# Patient Record
Sex: Female | Born: 1965 | Race: Black or African American | Hispanic: No | Marital: Single | Smoking: Never smoker
Health system: Southern US, Community
[De-identification: ages and names within clinical notes are randomized; demographics above are authoritative.]

## PROBLEM LIST (undated history)

## (undated) DIAGNOSIS — I10 Essential (primary) hypertension: Secondary | ICD-10-CM

## (undated) HISTORY — PX: ABDOMINAL HYSTERECTOMY: SHX81

---

## 2007-02-02 ENCOUNTER — Ambulatory Visit: Payer: Self-pay

## 2007-02-06 ENCOUNTER — Ambulatory Visit: Payer: Self-pay

## 2007-11-19 ENCOUNTER — Ambulatory Visit: Payer: Self-pay | Admitting: Obstetrics and Gynecology

## 2009-02-03 ENCOUNTER — Ambulatory Visit: Payer: Self-pay

## 2013-01-23 ENCOUNTER — Ambulatory Visit: Payer: Self-pay | Admitting: Nurse Practitioner

## 2013-01-31 ENCOUNTER — Ambulatory Visit: Payer: Self-pay | Admitting: Nurse Practitioner

## 2013-03-03 ENCOUNTER — Ambulatory Visit: Payer: Self-pay | Admitting: Nurse Practitioner

## 2013-04-02 ENCOUNTER — Ambulatory Visit: Payer: Self-pay | Admitting: Nurse Practitioner

## 2013-05-27 ENCOUNTER — Ambulatory Visit: Payer: Self-pay | Admitting: Nurse Practitioner

## 2013-06-03 ENCOUNTER — Ambulatory Visit: Payer: Self-pay | Admitting: Nurse Practitioner

## 2014-09-01 ENCOUNTER — Ambulatory Visit: Payer: Self-pay | Admitting: Family Medicine

## 2016-07-22 ENCOUNTER — Other Ambulatory Visit: Payer: Self-pay | Admitting: Family Medicine

## 2016-07-22 DIAGNOSIS — Z1231 Encounter for screening mammogram for malignant neoplasm of breast: Secondary | ICD-10-CM

## 2016-07-27 ENCOUNTER — Ambulatory Visit
Admission: RE | Admit: 2016-07-27 | Discharge: 2016-07-27 | Disposition: A | Discharge status: Home / Self Care | Payer: 59 | Source: Ambulatory Visit | Attending: Family Medicine | Admitting: Family Medicine

## 2016-07-27 ENCOUNTER — Encounter: Payer: Self-pay | Admitting: Radiology

## 2016-07-27 DIAGNOSIS — Z1231 Encounter for screening mammogram for malignant neoplasm of breast: Secondary | ICD-10-CM | POA: Diagnosis present

## 2017-06-10 ENCOUNTER — Ambulatory Visit (HOSPITAL_COMMUNITY)
Admission: EM | Admit: 2017-06-10 | Discharge: 2017-06-10 | Disposition: A | Discharge status: Home / Self Care | Payer: 59

## 2017-07-10 ENCOUNTER — Other Ambulatory Visit: Payer: Self-pay | Admitting: Family Medicine

## 2017-07-10 DIAGNOSIS — Z1231 Encounter for screening mammogram for malignant neoplasm of breast: Secondary | ICD-10-CM

## 2017-08-02 ENCOUNTER — Ambulatory Visit
Admission: RE | Admit: 2017-08-02 | Discharge: 2017-08-02 | Disposition: A | Discharge status: Home / Self Care | Payer: BLUE CROSS/BLUE SHIELD | Source: Ambulatory Visit | Attending: Family Medicine | Admitting: Family Medicine

## 2017-08-02 DIAGNOSIS — Z1231 Encounter for screening mammogram for malignant neoplasm of breast: Secondary | ICD-10-CM

## 2018-01-08 ENCOUNTER — Encounter (INDEPENDENT_AMBULATORY_CARE_PROVIDER_SITE_OTHER): Payer: Self-pay | Admitting: Orthopedic Surgery

## 2018-01-08 ENCOUNTER — Ambulatory Visit (INDEPENDENT_AMBULATORY_CARE_PROVIDER_SITE_OTHER): Payer: BLUE CROSS/BLUE SHIELD | Admitting: Orthopedic Surgery

## 2018-01-08 DIAGNOSIS — M7542 Impingement syndrome of left shoulder: Secondary | ICD-10-CM

## 2018-01-11 ENCOUNTER — Encounter (INDEPENDENT_AMBULATORY_CARE_PROVIDER_SITE_OTHER): Payer: Self-pay | Admitting: Orthopedic Surgery

## 2018-01-11 NOTE — Progress Notes (Signed)
Office Visit Note   Patient: Leah Rush           Date of Birth: 04-09-1966           MRN: [id number] Visit Date: 01/08/2018 Requested by: Rayetta HumphreyGeorge, Sionne A, MD [address] [place], [place] [id number] PCP: Rayetta HumphreyGeorge, Sionne A, MD  Subjective: Chief Complaint  Patient presents with  . Left Shoulder - Pain  . Right Shoulder - Pain    HPI: Leah Rush is a 52 year old patient with bilateral shoulder pain left worse than right.  Pain is been going on for 3 months.  Denies any history of injury.  Reported relatively acute onset of pain which is fairly constant.  Will wake her from sleep at night.  She is right-hand dominant.  Saw primary care provider who did radiographs and MRI of left shoulder.  That MRI scan is reviewed.  She does have a little bit of calcification at the superior attachment site of the subscap.  Cervical spine plain radiographs show mild degenerative disc disease at C5-6.  She reports pain in the deltoid region with forward flexion more than 90 degrees.  Takes occasional ibuprofen for her symptoms.  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  Wide grip hurts more than close grip.              ROS: All systems reviewed are negative as they relate to the chief complaint within the history of present illness.  Patient denies  fevers or chills.   Assessment & Plan: Visit Diagnoses:  1. Impingement syndrome of left shoulder     Plan: Impression is impingement and possible symptomatic calcific tendinitis in that left shoulder.  Plan is subacromial injection today for pain relief.  Rotator cuff looks reasonable otherwise.  Labrum also looks reasonable.  If this does not help then we could consider some type of operative intervention.  I will see her back as needed.  I do want her to take about a week off from lifting the let this shot kick in.  Follow-Up Instructions: Return if symptoms worsen or fail to improve.    Orders:  No orders of the defined types were placed in this encounter.  No orders of the defined types were placed in this encounter.     Procedures: No procedures performed   Clinical Data: No additional findings.  Objective: Vital Signs: There were no vitals taken for this visit.  Physical Exam:   Constitutional: Patient appears well-developed HEENT:  Head: Normocephalic Eyes:EOM are normal Neck: Normal range of motion Cardiovascular: Normal rate Pulmonary/chest: Effort normal Neurologic: Patient is alert Skin: Skin is warm Psychiatric: Patient has normal mood and affect    Ortho Exam: Orthopedic exam demonstrates pretty full active and passive range of motion of both shoulders with good cervical spine range of motion.  5 out of 5 grip EPL FPL interosseous wrist flexion wrist extension biceps triceps and deltoid strength.  Patient has palpable radial pulses.  No other masses lymph adenopathy or skin changes noted in that shoulder girdle region.  No restriction of passive range of motion in that shoulder.  Specialty Comments:  No specialty comments available.  Imaging: No results found.   PMFS History: There are no active problems to display for this patient.  History reviewed. No pertinent past medical history.  Family History  Problem Relation Age of Onset  . Breast cancer Neg Hx  Past Surgical History:  Procedure Laterality Date  . ABDOMINAL HYSTERECTOMY     Social History   Occupational History  . Not on file  Tobacco Use  . Smoking status: Not on file  Substance and Sexual Activity  . Alcohol use: Not on file  . Drug use: Not on file  . Sexual activity: Not on file

## 2018-03-17 ENCOUNTER — Ambulatory Visit (HOSPITAL_COMMUNITY)
Admission: EM | Admit: 2018-03-17 | Discharge: 2018-03-17 | Disposition: A | Discharge status: Home / Self Care | Payer: BLUE CROSS/BLUE SHIELD | Attending: Family Medicine | Admitting: Family Medicine

## 2018-03-17 ENCOUNTER — Other Ambulatory Visit: Payer: Self-pay

## 2018-03-17 ENCOUNTER — Encounter (HOSPITAL_COMMUNITY): Payer: Self-pay

## 2018-03-17 DIAGNOSIS — R05 Cough: Secondary | ICD-10-CM

## 2018-03-17 DIAGNOSIS — J029 Acute pharyngitis, unspecified: Secondary | ICD-10-CM | POA: Diagnosis not present

## 2018-03-17 DIAGNOSIS — J069 Acute upper respiratory infection, unspecified: Secondary | ICD-10-CM

## 2018-03-17 LAB — POCT RAPID STREP A: STREPTOCOCCUS, GROUP A SCREEN (DIRECT): NEGATIVE

## 2018-03-17 NOTE — Discharge Instructions (Signed)
Push fluids to ensure adequate hydration and keep secretions thin. Tylenol and/or ibuprofen as needed for pain or fevers.  Continue with teas, lozenges, broth, etc to help with sore throat.  Nasal sprays can be helpful with post nasal drip. If symptoms worsen or do not improve in the next week to return to be seen or to follow up with your PCP.

## 2018-03-17 NOTE — ED Triage Notes (Signed)
Pt presents today with sore throat and neck stiffness that started 2 days ago. States she has had some chills but no fever that she knows of. States she does have some drainage as well.

## 2018-03-17 NOTE — ED Provider Notes (Signed)
[facility]    CSN: [id number] Arrival date & time: 03/17/18  1447     History   Chief Complaint Chief Complaint  Patient presents with  . Sore Throat    HPI Roberto Scalesrnestine Tuckerman is a 52 y.o. female.   [name] presents with complaints of sore throat, post nasal drip, swelling near right ear and neck stiffness which started two days ago. Chills. Slight cough due to post nasal drip. No runny nose. No ear pain. No known ill contacts. No gi/gu complaints. Has been taking over the counter treatment which has slightly helped. States has a nasal spray at home which she does not remember the name, but does not take regularly. Has been drinking hot tea with lemon which helps some. Hx of htn.    ROS per HPI.      History reviewed. No pertinent past medical history.  There are no active problems to display for this patient.   Past Surgical History:  Procedure Laterality Date  . ABDOMINAL HYSTERECTOMY      OB History   None      Home Medications    Prior to Admission medications   Medication Sig Start Date End Date Taking? Authorizing Provider  metoprolol-hydrochlorothiazide (LOPRESSOR HCT) 50-25 MG tablet Take 1 tablet by mouth daily.   Yes [provider]    Family History Family History  Problem Relation Age of Onset  . Breast cancer Neg Hx     Social History Social History   Tobacco Use  . Smoking status: Never Smoker  . Smokeless tobacco: Never Used  Substance Use Topics  . Alcohol use: Not Currently  . Drug use: Not Currently     Allergies   Tetanus toxoid   Review of Systems Review of Systems   Physical Exam Triage Vital Signs ED Triage Vitals  Enc Vitals Group     BP 03/17/18 1607 130/66     Pulse Rate 03/17/18 1607 86     Resp 03/17/18 1607 16     Temp 03/17/18 1607 99.2 F (37.3 C)     Temp Source 03/17/18 1607 Oral     SpO2 03/17/18 1607 100 %     Weight --      Height --      Head Circumference --      Peak  Flow --      Pain Score 03/17/18 1608 7     Pain Loc --      Pain Edu? --      Excl. in GC? --    No data found.  Updated Vital Signs BP 130/66 (BP Location: Left Arm)   Pulse 86   Temp 99.2 F (37.3 C) (Oral)   Resp 16   SpO2 100%   Visual Acuity Right Eye Distance:   Left Eye Distance:   Bilateral Distance:    Right Eye Near:   Left Eye Near:    Bilateral Near:     Physical Exam  Constitutional: She is oriented to person, place, and time. She appears well-developed and well-nourished. No distress.  HENT:  Head: Normocephalic and atraumatic.  Right Ear: External ear and ear canal normal. Tympanic membrane is injected.  Left Ear: Tympanic membrane, external ear and ear canal normal.  Nose: Nose normal.  Mouth/Throat: Uvula is midline, oropharynx is clear and moist and mucous membranes are normal. Tonsils are 1+ on the right. Tonsils are 1+ on the left. No tonsillar exudate.  Palpable preauricular nodes to right; non  tender; minimal swelling   Eyes: Pupils are equal, round, and reactive to light. Conjunctivae and EOM are normal.  Cardiovascular: Normal rate, regular rhythm and normal heart sounds.  Pulmonary/Chest: Effort normal and breath sounds normal.  Lymphadenopathy:    She has no cervical adenopathy.  Neurological: She is alert and oriented to person, place, and time.  Skin: Skin is warm and dry.     UC Treatments / Results  Labs (all labs ordered are listed, but only abnormal results are displayed) Labs Reviewed  CULTURE, GROUP A STREP [facility])  POCT RAPID STREP A    EKG None  Radiology No results found.  Procedures Procedures (including critical care time)  Medications Ordered in UC Medications - No data to display  Initial Impression / Assessment and Plan / UC Course  I have reviewed the triage vital signs and the nursing notes.  Pertinent labs & imaging results that were available during my care of the patient were reviewed by me and  considered in my medical decision making (see chart for details).     Negative rapid strep. Benign physical exam. History and physical consistent with viral illness.  Supportive cares recommended. Return precautions provided.  Patient verbalized understanding and agreeable to plan.  Ambulatory out of clinic without difficulty.    Final Clinical Impressions(s) / UC Diagnoses   Final diagnoses:  Upper respiratory tract infection, unspecified type     Discharge Instructions     Push fluids to ensure adequate hydration and keep secretions thin. Tylenol and/or ibuprofen as needed for pain or fevers.  Continue with teas, lozenges, broth, etc to help with sore throat.  Nasal sprays can be helpful with post nasal drip. If symptoms worsen or do not improve in the next week to return to be seen or to follow up with your PCP.     ED Prescriptions    None     Controlled Substance Prescriptions [place] Controlled Substance Registry consulted? Not Applicable   Georgetta Haber, NP 03/17/18 1702

## 2018-03-20 LAB — CULTURE, GROUP A STREP (THRC)

## 2018-05-03 ENCOUNTER — Other Ambulatory Visit: Payer: Self-pay | Admitting: Family Medicine

## 2018-05-03 DIAGNOSIS — E041 Nontoxic single thyroid nodule: Secondary | ICD-10-CM

## 2018-05-03 DIAGNOSIS — I10 Essential (primary) hypertension: Secondary | ICD-10-CM

## 2018-06-28 ENCOUNTER — Other Ambulatory Visit: Payer: Self-pay | Admitting: Family Medicine

## 2018-06-28 DIAGNOSIS — Z1231 Encounter for screening mammogram for malignant neoplasm of breast: Secondary | ICD-10-CM

## 2018-08-06 ENCOUNTER — Encounter (INDEPENDENT_AMBULATORY_CARE_PROVIDER_SITE_OTHER): Payer: Self-pay

## 2018-08-06 ENCOUNTER — Ambulatory Visit
Admission: RE | Admit: 2018-08-06 | Discharge: 2018-08-06 | Disposition: A | Discharge status: Home / Self Care | Payer: BLUE CROSS/BLUE SHIELD | Source: Ambulatory Visit | Attending: Family Medicine | Admitting: Family Medicine

## 2018-08-06 DIAGNOSIS — Z1231 Encounter for screening mammogram for malignant neoplasm of breast: Secondary | ICD-10-CM | POA: Diagnosis present

## 2019-05-06 ENCOUNTER — Ambulatory Visit: Payer: Self-pay

## 2019-05-06 ENCOUNTER — Other Ambulatory Visit: Payer: Self-pay

## 2019-05-06 VITALS — BP 140/98 | HR 84 | Temp 98.8°F | Resp 17

## 2019-05-06 DIAGNOSIS — S80869A Insect bite (nonvenomous), unspecified lower leg, initial encounter: Secondary | ICD-10-CM

## 2019-05-06 MED ORDER — DIPHENHYDRAMINE HCL 25 MG PO CAPS
25.0000 mg | ORAL_CAPSULE | Freq: Once | ORAL | Status: AC
  Administered 2019-05-06: 25 mg via ORAL

## 2019-05-06 MED ORDER — BACITRACIN-NEOMYCIN-POLYMYXIN 400-5-5000 EX OINT
TOPICAL_OINTMENT | Freq: Once | CUTANEOUS | Status: AC
  Administered 2019-05-06: 11:00:00 via TOPICAL

## 2019-05-06 NOTE — Patient Instructions (Signed)
Diphenhydramine capsules or tablets What is this medicine? DIPHENHYDRAMINE (dye fen HYE dra meen) is an antihistamine. It is used to treat the symptoms of an allergic reaction. It is also used to treat Parkinson's disease. This medicine is also used to prevent and to treat motion sickness and as a nighttime sleep aid. This medicine may be used for other purposes; ask your health care provider or pharmacist if you have questions. COMMON BRAND NAME(S): Alka-Seltzer Plus Allergy, Aller-G-Time, Banophen, Benadryl Allergy, Benadryl Allergy Dye Free, Benadryl Allergy Kapgel, Benadryl Allergy Ultratab, Diphedryl, Diphenhist, Genahist, Geri-Dryl, PHARBEDRYL, Q-Dryl, Gretta Began, Valu-Dryl, Vicks ZzzQuil Nightime Sleep-Aid What should I tell my health care provider before I take this medicine? They need to know if you have any of these conditions:  asthma or lung disease  glaucoma  high blood pressure or heart disease  liver disease  pain or difficulty passing urine  prostate trouble  ulcers or other stomach problems  an unusual or allergic reaction to diphenhydramine, other medicines foods, dyes, or preservatives such as sulfites  pregnant or trying to get pregnant  breast-feeding How should I use this medicine? Take this medicine by mouth with a full glass of water. Follow the directions on the prescription label. Take your doses at regular intervals. Do not take your medicine more often than directed. To prevent motion sickness start taking this medicine 30 to 60 minutes before you leave. Talk to your pediatrician regarding the use of this medicine in children. Special care may be needed. Patients over 56 years old may have a stronger reaction and need a smaller dose. Overdosage: If you think you have taken too much of this medicine contact a poison control center or emergency room at once. NOTE: This medicine is only for you. Do not share this medicine with others. What if I miss a dose?  If you miss a dose, take it as soon as you can. If it is almost time for your next dose, take only that dose. Do not take double or extra doses. What may interact with this medicine? Do not take this medicine with any of the following medications:  MAOIs like Carbex, Eldepryl, Marplan, Nardil, and Parnate This medicine may also interact with the following medications:  alcohol  barbiturates, like phenobarbital  medicines for bladder spasm like oxybutynin, tolterodine  medicines for blood pressure  medicines for depression, anxiety, or psychotic disturbances  medicines for movement abnormalities or Parkinson's disease  medicines for sleep  other medicines for cold, cough or allergy  some medicines for the stomach like chlordiazepoxide, dicyclomine This list may not describe all possible interactions. Give your health care provider a list of all the medicines, herbs, non-prescription drugs, or dietary supplements you use. Also tell them if you smoke, drink alcohol, or use illegal drugs. Some items may interact with your medicine. What should I watch for while using this medicine? Visit your doctor or health care professional for regular check ups. Tell your doctor if your symptoms do not improve or if they get worse. Your mouth may get dry. Chewing sugarless gum or sucking hard candy, and drinking plenty of water may help. Contact your doctor if the problem does not go away or is severe. This medicine may cause dry eyes and blurred vision. If you wear contact lenses you may feel some discomfort. Lubricating drops may help. See your eye doctor if the problem does not go away or is severe. You may get drowsy or dizzy. Do not drive, use machinery, or  do anything that needs mental alertness until you know how this medicine affects you. Do not stand or sit up quickly, especially if you are an older patient. This reduces the risk of dizzy or fainting spells. Alcohol may interfere with the  effect of this medicine. Avoid alcoholic drinks. What side effects may I notice from receiving this medicine? Side effects that you should report to your doctor or health care professional as soon as possible:  allergic reactions like skin rash, itching or hives, swelling of the face, lips, or tongue  changes in vision  confused, agitated, nervous  irregular or fast heartbeat  tremor  trouble passing urine  unusual bleeding or bruising  unusually weak or tired Side effects that usually do not require medical attention (report to your doctor or health care professional if they continue or are bothersome):  constipation, diarrhea  drowsy  headache  loss of appetite  stomach upset, vomiting  thick mucous This list may not describe all possible side effects. Call your doctor for medical advice about side effects. You may report side effects to FDA at 1-800-FDA-1088. Where should I keep my medicine? Keep out of the reach of children. Store at room temperature between 15 and 30 degrees C (59 and 86 degrees F). Keep container closed tightly. Throw away any unused medicine after the expiration date. NOTE: This sheet is a summary. It may not cover all possible information. If you have questions about this medicine, talk to your doctor, pharmacist, or health care provider.  2020 Elsevier/Gold Standard (2008-01-07 17:06:22) Insect Bite, Adult An insect bite can make your skin red, itchy, and swollen. Some insects can spread disease to people with a bite. However, most insect bites do not lead to disease, and most are not serious. What are the causes? Insects may bite for many reasons, including:  Hunger.  To defend themselves. Insects that bite include:  Spiders.  Mosquitoes.  Ticks.  Fleas.  Ants.  Flies.  Kissing bugs.  Chiggers. What are the signs or symptoms? Symptoms of this condition include:  Itching or pain in the bite area.  Redness and swelling in  the bite area.  An open wound (skin ulcer). Symptoms often last for 2-4 days. In rare cases, a person may have a very bad allergic reaction (anaphylactic reaction) to a bite. Symptoms of an anaphylactic reaction may include:  Feeling warm in the face (flushed). Your face may turn red.  Itchy, red, swollen areas of skin (hives).  Swelling of the: ? Eyes. ? Lips. ? Face. ? Mouth. ? Tongue. ? Throat.  Trouble with any of these: ? Breathing. ? Talking. ? Swallowing.  Loud breathing (wheezing).  Feeling dizzy or light-headed.  Passing out (fainting).  Pain or cramps in your belly.  Throwing up (vomiting).  Watery poop (diarrhea). How is this treated? Treatment is usually not needed. Symptoms often go away on their own. When treatment is needed, it may involve:  Putting a cream or lotion on the bite area. This helps with itching.  Taking an antibiotic medicine. This treatment is needed if the bite area gets infected.  Getting a tetanus shot, if you are not up to date on this vaccine.  Putting ice on the affected area.  Using medicines called antihistamines. This treatment may be needed if you have itching or an allergic reaction to the insect bite.  Giving yourself a shot of medicine (epinephrine) using an auto-injector "pen" if you have an anaphylactic reaction to a bite. Your  doctor will teach you how to use this pen. Follow these instructions at home: Bite area care   Do not scratch the bite area.  Keep the bite area clean and dry.  Wash the bite area every day with soap and water as told by your doctor.  Check the bite area every day for signs of infection. Check for: ? Redness, swelling, or pain. ? Fluid or blood. ? Warmth. ? Pus or a bad smell. Managing pain, itching, and swelling   You may put any of these on the bite area as told by your doctor: ? A paste made of baking soda and water. ? Cortisone cream. ? Calamine lotion.  If told, put ice on  the bite area. ? Put ice in a plastic bag. ? Place a towel between your skin and the bag. ? Leave the ice on for 20 minutes, 2-3 times a day. General instructions  Apply or take over-the-counter and prescription medicines only as told by your doctor.  If you were prescribed an antibiotic medicine, take or apply it as told by your doctor. Do not stop using the antibiotic even if your condition improves.  Keep all follow-up visits as told by your doctor. This is important. How is this prevented? To help you have a lower risk of insect bites:  When you are outside, wear clothing that covers your arms and legs.  Use insect repellent. The best insect repellents contain one of these: ? DEET. ? Picaridin. ? Oil of lemon eucalyptus (OLE). ? IR3535.  Consider spraying your clothing with a pesticide called permethrin. Permethrin helps prevent insect bites. It works for several weeks and for up to 5-6 clothing washes. Do not apply permethrin directly to the skin.  If your home windows do not have screens, think about putting some in.  If you will be sleeping in an area where there are mosquitoes, consider covering your sleeping area with a mosquito net. Contact a doctor if:  You have redness, swelling, or pain in the bite area.  You have fluid or blood coming from the bite area.  The bite area feels warm to the touch.  You have pus or a bad smell coming from the bite area.  You have a fever. Get help right away if:  You have joint pain.  You have a rash.  You feel more tired or sleepy than you normally do.  You have neck pain.  You have a headache.  You feel weaker than you normally do.  You have signs of an anaphylactic reaction. Signs may include: ? Feeling warm in the face. ? Itchy, red, swollen areas of skin. ? Swelling of your:  Eyes.  Lips.  Face.  Mouth.  Tongue.  Throat. ? Trouble with any of these:  Breathing.  Talking.  Swallowing. ? Loud  breathing. ? Feeling dizzy or light-headed. ? Passing out. ? Pain or cramps in your belly. ? Throwing up. ? Watery poop. These symptoms may be an emergency. Do not wait to see if the symptoms will go away. Do this right away:  Use your auto-injector pen as you have been told.  Get medical help. Call your local emergency services (911 in the [place] Do not drive yourself to the hospital. Summary  An insect bite can make your skin red, itchy, and swollen.  Treatment is usually not needed. Symptoms often go away on their own.  Do not scratch the bite area. Keep it clean and dry.  Ice can  help with pain and itching from the bite. This information is not intended to replace advice given to you by your health care provider. Make sure you discuss any questions you have with your health care provider. Document Released: 09/16/2000 Document Revised: 03/30/2018 Document Reviewed: 03/30/2018 Elsevier Patient Education  2020 [employer]

## 2019-05-06 NOTE — Progress Notes (Signed)
   Subjective:    Patient ID: Leah Rush, female    DOB: Nov 20, 1965, 53 y.o.   MRN: [id number]  Patient presents to clinic with 3 insect bites noted to the posterior of of both legs. Patient stated the incident occurred 05/03/19 but she didn't think much of it at the time. She noted that the left posterior insect bite started to drain clear serosanguinous fluid over the weekend but no purulent drainage noted. The 2 insect bites to the right posterior popliteal area are noted to be open with no drainage but hard in character.     Review of Systems     Objective:   Physical Exam  Assessment & Plan:   Wt Readings from Last 3 Encounters:  No data found for Wt   Temp Readings from Last 3 Encounters:  05/06/19 98.8 F (37.1 C) (Oral)  03/17/18 99.2 F (37.3 C) (Oral)   BP Readings from Last 3 Encounters:  05/06/19 (!) 140/98  03/17/18 130/66   Pulse Readings from Last 3 Encounters:  05/06/19 84  03/17/18 86     No results found for: POCGLU         Thank you!!  [place] Nurse Specialist [place]: [phone]  Cell:  [phone] Website: [email or web address]

## 2019-06-26 ENCOUNTER — Ambulatory Visit: Payer: Self-pay

## 2019-06-26 ENCOUNTER — Other Ambulatory Visit: Payer: Self-pay

## 2019-06-26 VITALS — BP 138/94 | HR 88 | Resp 14 | Ht 62.0 in | Wt 178.4 lb

## 2019-06-26 DIAGNOSIS — Z008 Encounter for other general examination: Secondary | ICD-10-CM

## 2019-06-26 LAB — POCT LIPID PANEL
HDL: 47
LDL: 62
Non-HDL: 116
TC/HDL: 3.5
TC: 163
TRG: 271

## 2019-06-26 LAB — GLUCOSE, POCT (MANUAL RESULT ENTRY): POC Glucose: 107 mg/dl — AB (ref 70–99)

## 2019-06-26 NOTE — Progress Notes (Signed)
     Patient ID: Leah Rush, female    DOB: May 30, 1966, 53 y.o.   MRN: [id number]    Thank you!!  Apolonio Schneiders RN  [place] Nurse Specialist [place]: [phone]  Cell:  [phone] Website: [email or web address]

## 2019-06-26 NOTE — Patient Instructions (Signed)
High Triglycerides Eating Plan Triglycerides are a type of fat in the blood. High levels of triglycerides can increase your risk of heart disease and stroke. If your triglyceride levels are high, choosing the right foods can help lower your triglycerides and keep your heart healthy. Work with your health care provider or a diet and nutrition specialist (dietitian) to develop an eating plan that is right for you. What are tips for following this plan? General guidelines   Lose weight, if you are overweight. For most people, losing 5-10 lbs (2-5 kg) helps lower triglyceride levels. A weight-loss plan may include. ? 30 minutes of exercise at least 5 days a week. ? Reducing the amount of calories, sugar, and fat you eat.  Eat a wide variety of fresh fruits, vegetables, and whole grains. These foods are high in fiber.  Eat foods that contain healthy fats, such as fatty fish, nuts, seeds, and olive oil.  Avoid foods that are high in added sugar, added salt (sodium), saturated fat, and trans fat.  Avoid low-fiber, refined carbohydrates such as white bread, crackers, noodles, and white rice.  Avoid foods with partially hydrogenated oils (trans fats), such as fried foods or stick margarine.  Limit alcohol intake to no more than 1 drink a day for nonpregnant women and 2 drinks a day for men. One drink equals 12 oz of beer, 5 oz of wine, or 1 oz of hard liquor. Your health care provider may recommend that you drink less depending on your overall health. Reading food labels  Check food labels for the amount of saturated fat. Choose foods with no or very little saturated fat.  Check food labels for the amount of trans fat. Choose foods with no trans fat.  Check food labels for the amount of cholesterol. Choose foods low in cholesterol. Ask your dietitian how much cholesterol you should have each day.  Check food labels for the amount of sodium. Choose foods with less than 140 milligrams (mg) per  serving. Shopping  Buy dairy products labeled as nonfat (skim) or low-fat (1%).  Avoid buying processed or prepackaged foods. These are often high in added sugar, sodium, and fat. Cooking  Choose healthy fats when cooking, such as olive oil or canola oil.  Cook foods using lower fat methods, such as baking, broiling, boiling, or grilling.  Make your own sauces, dressings, and marinades when possible, instead of buying them. Store-bought sauces, dressings, and marinades are often high in sodium and sugar. Meal planning  Eat more home-cooked food and less restaurant, buffet, and fast food.  Eat fatty fish at least 2 times each week. Examples of fatty fish include salmon, trout, mackerel, tuna, and herring.  If you eat whole eggs, do not eat more than 3 egg yolks per week. What foods are recommended? The items listed may not be a complete list. Talk with your dietitian about what dietary choices are best for you. Grains Whole wheat or whole grain breads, crackers, cereals, and pasta. Unsweetened oatmeal. Bulgur. Barley. Quinoa. Brown rice. Whole wheat flour tortillas. Vegetables Fresh or frozen vegetables. Low-sodium canned vegetables. Fruits All fresh, canned (in natural juice), or frozen fruits. Meats and other protein foods Skinless chicken or turkey. Ground chicken or turkey. Lean cuts of pork, trimmed of fat. Fish and seafood, especially salmon, trout, and herring. Egg whites. Dried beans, peas, or lentils. Unsalted nuts or seeds. Unsalted canned beans. Natural peanut or almond butter. Dairy Low-fat dairy products. Skim or low-fat (1%) milk. Reduced fat (  2%) and low-sodium cheese. Low-fat ricotta cheese. Low-fat cottage cheese. Plain, low-fat yogurt. Fats and oils Tub margarine without trans fats. Light or reduced-fat mayonnaise. Light or reduced-fat salad dressings. Avocado. Safflower, olive, sunflower, soybean, and canola oils. What foods are not recommended? The items listed  may not be a complete list. Talk with your dietitian about what dietary choices are best for you. Grains White bread. White (regular) pasta. White rice. Cornbread. Bagels. Pastries. Crackers that contain trans fat. Vegetables Creamed or fried vegetables. Vegetables in a cheese sauce. Fruits Sweetened dried fruit. Canned fruit in syrup. Fruit juice. Meats and other protein foods Fatty cuts of meat. Ribs. Chicken wings. Bacon. Sausage. Bologna. Salami. Chitterlings. Fatback. Hot dogs. Bratwurst. Packaged lunch meats. Dairy Whole or reduced-fat (2%) milk. Half-and-half. Cream cheese. Full-fat or sweetened yogurt. Full-fat cheese. Nondairy creamers. Whipped toppings. Processed cheese or cheese spreads. Cheese curds. Beverages Alcohol. Sweetened drinks, such as soda, lemonade, fruit drinks, or punches. Fats and oils Butter. Stick margarine. Lard. Shortening. Ghee. Bacon fat. Tropical oils, such as coconut, palm kernel, or palm oils. Sweets and desserts Corn syrup. Sugars. Honey. Molasses. Candy. Jam and jelly. Syrup. Sweetened cereals. Cookies. Pies. Cakes. Donuts. Muffins. Ice cream. Condiments Store-bought sauces, dressings, and marinades that are high in sugar, such as ketchup and barbecue sauce. Summary  High levels of triglycerides can increase the risk of heart disease and stroke. Choosing the right foods can help lower your triglycerides.  Eat plenty of fresh fruits, vegetables, and whole grains. Choose low-fat dairy and lean meats. Eat fatty fish at least twice a week.  Avoid processed and prepackaged foods with added sugar, sodium, saturated fat, and trans fat.  If you need suggestions or have questions about what types of food are good for you, talk with your health care provider or a dietitian. This information is not intended to replace advice given to you by your health care provider. Make sure you discuss any questions you have with your health care provider. Document Released:  07/07/2004 Document Revised: 09/01/2017 Document Reviewed: 11/22/2016 Elsevier Patient Education  2020 Elsevier Inc.  

## 2019-09-30 ENCOUNTER — Other Ambulatory Visit: Payer: Self-pay | Admitting: Family Medicine

## 2019-09-30 DIAGNOSIS — Z1231 Encounter for screening mammogram for malignant neoplasm of breast: Secondary | ICD-10-CM

## 2019-10-01 ENCOUNTER — Other Ambulatory Visit: Payer: Self-pay

## 2019-10-01 ENCOUNTER — Ambulatory Visit
Admission: RE | Admit: 2019-10-01 | Discharge: 2019-10-01 | Disposition: A | Discharge status: Home / Self Care | Payer: BC Managed Care – PPO | Source: Ambulatory Visit | Attending: Family Medicine | Admitting: Family Medicine

## 2019-10-01 DIAGNOSIS — Z1231 Encounter for screening mammogram for malignant neoplasm of breast: Secondary | ICD-10-CM | POA: Insufficient documentation

## 2020-06-10 ENCOUNTER — Other Ambulatory Visit: Payer: Self-pay | Admitting: Family Medicine

## 2020-06-10 DIAGNOSIS — E041 Nontoxic single thyroid nodule: Secondary | ICD-10-CM

## 2020-06-19 ENCOUNTER — Other Ambulatory Visit: Payer: Self-pay

## 2020-06-19 ENCOUNTER — Ambulatory Visit
Admission: RE | Admit: 2020-06-19 | Discharge: 2020-06-19 | Disposition: A | Discharge status: Home / Self Care | Payer: BC Managed Care – PPO | Source: Ambulatory Visit | Attending: Family Medicine | Admitting: Family Medicine

## 2020-06-19 DIAGNOSIS — E041 Nontoxic single thyroid nodule: Secondary | ICD-10-CM | POA: Diagnosis present

## 2020-10-20 ENCOUNTER — Ambulatory Visit
Admission: RE | Admit: 2020-10-20 | Discharge: 2020-10-20 | Disposition: A | Discharge status: Home / Self Care | Payer: BC Managed Care – PPO | Attending: Family Medicine | Admitting: Family Medicine

## 2020-10-20 ENCOUNTER — Other Ambulatory Visit: Payer: Self-pay | Admitting: Family Medicine

## 2020-10-20 ENCOUNTER — Ambulatory Visit
Admission: RE | Admit: 2020-10-20 | Discharge: 2020-10-20 | Disposition: A | Discharge status: Home / Self Care | Payer: BC Managed Care – PPO | Source: Ambulatory Visit | Attending: Family Medicine | Admitting: Family Medicine

## 2020-10-20 ENCOUNTER — Other Ambulatory Visit: Payer: Self-pay

## 2020-10-20 DIAGNOSIS — R059 Cough, unspecified: Secondary | ICD-10-CM

## 2020-12-07 ENCOUNTER — Other Ambulatory Visit: Payer: Self-pay | Admitting: Family Medicine

## 2020-12-07 DIAGNOSIS — Z1231 Encounter for screening mammogram for malignant neoplasm of breast: Secondary | ICD-10-CM

## 2020-12-16 ENCOUNTER — Ambulatory Visit
Admission: RE | Admit: 2020-12-16 | Discharge: 2020-12-16 | Disposition: A | Discharge status: Home / Self Care | Payer: BC Managed Care – PPO | Source: Ambulatory Visit | Attending: Family Medicine | Admitting: Family Medicine

## 2020-12-16 ENCOUNTER — Other Ambulatory Visit: Payer: Self-pay

## 2020-12-16 DIAGNOSIS — Z1231 Encounter for screening mammogram for malignant neoplasm of breast: Secondary | ICD-10-CM | POA: Diagnosis not present

## 2021-11-10 ENCOUNTER — Other Ambulatory Visit: Payer: Self-pay | Admitting: Family Medicine

## 2021-11-10 DIAGNOSIS — Z1231 Encounter for screening mammogram for malignant neoplasm of breast: Secondary | ICD-10-CM

## 2022-01-04 ENCOUNTER — Ambulatory Visit
Admission: RE | Admit: 2022-01-04 | Discharge: 2022-01-04 | Disposition: A | Discharge status: Home / Self Care | Payer: BC Managed Care – PPO | Source: Ambulatory Visit | Attending: Family Medicine | Admitting: Family Medicine

## 2022-01-04 DIAGNOSIS — Z1231 Encounter for screening mammogram for malignant neoplasm of breast: Secondary | ICD-10-CM | POA: Insufficient documentation

## 2022-01-10 ENCOUNTER — Other Ambulatory Visit: Payer: Self-pay | Admitting: Family Medicine

## 2022-01-10 DIAGNOSIS — N63 Unspecified lump in unspecified breast: Secondary | ICD-10-CM

## 2022-01-10 DIAGNOSIS — R928 Other abnormal and inconclusive findings on diagnostic imaging of breast: Secondary | ICD-10-CM

## 2022-01-12 ENCOUNTER — Inpatient Hospital Stay: Admission: RE | Admit: 2022-01-12 | Payer: BC Managed Care – PPO | Source: Ambulatory Visit

## 2022-01-25 ENCOUNTER — Ambulatory Visit
Admission: RE | Admit: 2022-01-25 | Discharge: 2022-01-25 | Disposition: A | Discharge status: Home / Self Care | Payer: BC Managed Care – PPO | Source: Ambulatory Visit | Attending: Family Medicine | Admitting: Family Medicine

## 2022-01-25 DIAGNOSIS — R928 Other abnormal and inconclusive findings on diagnostic imaging of breast: Secondary | ICD-10-CM | POA: Diagnosis present

## 2022-01-25 DIAGNOSIS — N63 Unspecified lump in unspecified breast: Secondary | ICD-10-CM | POA: Diagnosis present

## 2022-10-18 ENCOUNTER — Ambulatory Visit: Payer: Self-pay | Admitting: Surgery

## 2022-10-18 NOTE — H&P (View-Only) (Signed)
Subjective:   CC: Epidermal cyst [L72.0]  HPI:  Leah Rush is a 57 y.o. female who was referred by Dionicia Abler, PA for evaluation of above. First noted several months ago.  Symptoms include: Pain is sharp, intermittent, right axilla.  Exacerbated by nothing specific.  Alleviated by abx.  Associated with drainage and open wound, recurrent.     Past Medical History:  has a past medical history of Allergy, Arthritis, GERD (gastroesophageal reflux disease), History of low back pain, History of uterine fibroid, Hypertension (10/03/1993), Right thyroid nodule (09/16/2014), and Vaginal itching.  Past Surgical History:  has a past surgical history that includes Cesarean section; Tubal ligation; Salpingo Oophorectomy (Bilateral); and Hysterectomy (12/2007).  Family History: family history includes Colon polyps (age of onset: 12) in her father; High blood pressure (Hypertension) in her father, mother, and sister.  Social History:  reports that she has never smoked. She has never been exposed to tobacco smoke. She has never used smokeless tobacco. She reports that she does not currently use alcohol. She reports that she does not use drugs.  Current Medications: has a current medication list which includes the following prescription(s): metoprolol succinate, naproxen, and spironolactone.  Allergies:  Allergies  Allergen Reactions   Tetanus Toxoid Fluid Other (See Comments)    Other Reaction: swelling at injection site    ROS:  A 15 point review of systems was performed and pertinent positives and negatives noted in HPI   Objective:     BP (!) 153/97   Pulse 83   Ht 157.5 cm (5\' 2" )   Wt 79.8 kg (176 lb)   BMI 32.19 kg/m   Constitutional :  No distress, cooperative, alert  Lymphatics/Throat:  Supple with no lymphadenopathy  Respiratory:  Clear to auscultation bilaterally  Cardiovascular:  Regular rate and rhythm  Gastrointestinal: Soft, non-tender, non-distended, no  organomegaly.  Musculoskeletal: Steady gait and movement  Skin: Cool and moist. Chaperone present for exam.  Right axilla with open wound and punctuate purulent drainage, consistent for epidermal cyst, ruptured  Psychiatric: Normal affect, non-agitated, not confused         LABS:  N/a   RADS: CLINICAL DATA:  Patient returns after screening study evaluation of  possible RIGHT axillary mass.   EXAM:  ULTRASOUND OF THE RIGHT AXILLA   COMPARISON:  Previous exam(s).   FINDINGS:  On physical exam,there is a visible raised lesion with central  punctum within a skin fold in the UPPER axilla. There is no  associated erythema. Patient reports lesion has been present for at  least years.   Ultrasound is performed, showing a hypoechoic parallel oval  circumscribed mass in the RIGHT axilla corresponding to the palpable  abnormality. Mass measures 1.6 x 0.9 x 1 5 centimeters. There is no  internal blood flow by Doppler evaluation. Findings are consistent  with intradermal inclusion cyst.   IMPRESSION:  Benign intradermal inclusion cyst in the RIGHT axilla accounting for  the screening abnormality. No ultrasound evidence for malignancy.   RECOMMENDATION:  Screening mammogram in one year.(Code:SM-B-01Y)   I have discussed the findings and recommendations with the patient.  If applicable, a reminder letter will be sent to the patient  regarding the next appointment.   BI-RADS CATEGORY  2: Benign.    Electronically Signed    By: Nolon Nations M.D.    On: 01/25/2022 15:05   Assessment:      Epidermal cyst [L72.0], right axilla  Plan:     1. Epidermal cyst [  L72.0] Discussed surgical excision.  Alternatives include continued observation.  Benefits include possible symptom relief, pathologic evaluation, improved cosmesis. Discussed the risk of surgery including recurrence, chronic pain, post-op infxn, poor cosmesis, poor/delayed wound healing, and possible re-operation to  address said risks. The risks of general anesthetic, if used, includes MI, CVA, sudden death or even reaction to anesthetic medications also discussed.  Typical post-op recovery time of 3-5 days with possible activity restrictions were also discussed.  The patient verbalized understanding and all questions were answered to the patient's satisfaction.  2. Patient has elected to proceed with surgical treatment. Procedure will be scheduled. In OR due to location and size, recurrent nature  labs/images/medications/previous chart entries reviewed personally and relevant changes/updates noted above.

## 2022-10-18 NOTE — H&P (Signed)
Subjective:   CC: Epidermal cyst [L72.0]  HPI:  Leah Rush is a 56 y.o. female who was referred by Celena Bounvilay, PA for evaluation of above. First noted several months ago.  Symptoms include: Pain is sharp, intermittent, right axilla.  Exacerbated by nothing specific.  Alleviated by abx.  Associated with drainage and open wound, recurrent.     Past Medical History:  has a past medical history of Allergy, Arthritis, GERD (gastroesophageal reflux disease), History of low back pain, History of uterine fibroid, Hypertension (10/03/1993), Right thyroid nodule (09/16/2014), and Vaginal itching.  Past Surgical History:  has a past surgical history that includes Cesarean section; Tubal ligation; Salpingo Oophorectomy (Bilateral); and Hysterectomy (12/2007).  Family History: family history includes Colon polyps (age of onset: 78) in her father; High blood pressure (Hypertension) in her father, mother, and sister.  Social History:  reports that she has never smoked. She has never been exposed to tobacco smoke. She has never used smokeless tobacco. She reports that she does not currently use alcohol. She reports that she does not use drugs.  Current Medications: has a current medication list which includes the following prescription(s): metoprolol succinate, naproxen, and spironolactone.  Allergies:  Allergies  Allergen Reactions   Tetanus Toxoid Fluid Other (See Comments)    Other Reaction: swelling at injection site    ROS:  A 15 point review of systems was performed and pertinent positives and negatives noted in HPI   Objective:     BP (!) 153/97   Pulse 83   Ht 157.5 cm (5' 2")   Wt 79.8 kg (176 lb)   BMI 32.19 kg/m   Constitutional :  No distress, cooperative, alert  Lymphatics/Throat:  Supple with no lymphadenopathy  Respiratory:  Clear to auscultation bilaterally  Cardiovascular:  Regular rate and rhythm  Gastrointestinal: Soft, non-tender, non-distended, no  organomegaly.  Musculoskeletal: Steady gait and movement  Skin: Cool and moist. Chaperone present for exam.  Right axilla with open wound and punctuate purulent drainage, consistent for epidermal cyst, ruptured  Psychiatric: Normal affect, non-agitated, not confused         LABS:  N/a   RADS: CLINICAL DATA:  Patient returns after screening study evaluation of  possible RIGHT axillary mass.   EXAM:  ULTRASOUND OF THE RIGHT AXILLA   COMPARISON:  Previous exam(s).   FINDINGS:  On physical exam,there is a visible raised lesion with central  punctum within a skin fold in the UPPER axilla. There is no  associated erythema. Patient reports lesion has been present for at  least years.   Ultrasound is performed, showing a hypoechoic parallel oval  circumscribed mass in the RIGHT axilla corresponding to the palpable  abnormality. Mass measures 1.6 x 0.9 x 1 5 centimeters. There is no  internal blood flow by Doppler evaluation. Findings are consistent  with intradermal inclusion cyst.   IMPRESSION:  Benign intradermal inclusion cyst in the RIGHT axilla accounting for  the screening abnormality. No ultrasound evidence for malignancy.   RECOMMENDATION:  Screening mammogram in one year.(Code:SM-B-01Y)   I have discussed the findings and recommendations with the patient.  If applicable, a reminder letter will be sent to the patient  regarding the next appointment.   BI-RADS CATEGORY  2: Benign.    Electronically Signed    By: Elizabeth  Brown M.D.    On: 01/25/2022 15:05   Assessment:      Epidermal cyst [L72.0], right axilla  Plan:     1. Epidermal cyst [  L72.0] Discussed surgical excision.  Alternatives include continued observation.  Benefits include possible symptom relief, pathologic evaluation, improved cosmesis. Discussed the risk of surgery including recurrence, chronic pain, post-op infxn, poor cosmesis, poor/delayed wound healing, and possible re-operation to  address said risks. The risks of general anesthetic, if used, includes MI, CVA, sudden death or even reaction to anesthetic medications also discussed.  Typical post-op recovery time of 3-5 days with possible activity restrictions were also discussed.  The patient verbalized understanding and all questions were answered to the patient's satisfaction.  2. Patient has elected to proceed with surgical treatment. Procedure will be scheduled. In OR due to location and size, recurrent nature  labs/images/medications/previous chart entries reviewed personally and relevant changes/updates noted above.

## 2022-10-24 ENCOUNTER — Encounter
Admission: RE | Admit: 2022-10-24 | Discharge: 2022-10-24 | Disposition: A | Discharge status: Home / Self Care | Payer: BC Managed Care – PPO | Source: Ambulatory Visit | Attending: Surgery | Admitting: Surgery

## 2022-10-24 VITALS — Ht 62.0 in | Wt 176.0 lb

## 2022-10-24 DIAGNOSIS — Z01818 Encounter for other preprocedural examination: Secondary | ICD-10-CM | POA: Insufficient documentation

## 2022-10-24 DIAGNOSIS — L720 Epidermal cyst: Secondary | ICD-10-CM | POA: Diagnosis not present

## 2022-10-24 DIAGNOSIS — I10 Essential (primary) hypertension: Secondary | ICD-10-CM

## 2022-10-24 DIAGNOSIS — Z01812 Encounter for preprocedural laboratory examination: Secondary | ICD-10-CM

## 2022-10-24 DIAGNOSIS — Z01810 Encounter for preprocedural cardiovascular examination: Secondary | ICD-10-CM

## 2022-10-24 HISTORY — DX: Essential (primary) hypertension: I10

## 2022-10-24 LAB — CBC
HCT: 39.7 % (ref 36.0–46.0)
Hemoglobin: 13.1 g/dL (ref 12.0–15.0)
MCH: 28.2 pg (ref 26.0–34.0)
MCHC: 33 g/dL (ref 30.0–36.0)
MCV: 85.4 fL (ref 80.0–100.0)
Platelets: 244 10*3/uL (ref 150–400)
RBC: 4.65 MIL/uL (ref 3.87–5.11)
RDW: 12.8 % (ref 11.5–15.5)
WBC: 7 10*3/uL (ref 4.0–10.5)
nRBC: 0 % (ref 0.0–0.2)

## 2022-10-24 LAB — BASIC METABOLIC PANEL
Anion gap: 7 (ref 5–15)
BUN: 14 mg/dL (ref 6–20)
CO2: 26 mmol/L (ref 22–32)
Calcium: 9 mg/dL (ref 8.9–10.3)
Chloride: 105 mmol/L (ref 98–111)
Creatinine, Ser: 0.79 mg/dL (ref 0.44–1.00)
GFR, Estimated: >60 mL/min (ref >60)
Glucose, Bld: 88 mg/dL (ref 70–99)
Potassium: 3.4 mmol/L — ABNORMAL LOW (ref 3.5–5.1)
Sodium: 138 mmol/L (ref 135–145)

## 2022-10-24 NOTE — Patient Instructions (Signed)
Your procedure is scheduled on: Wednesday October 26, 2022. Report to Day Surgery inside [facility] 2nd floor, stop by registration desk before getting on elevator. To find out your arrival time please call [phone] between 1PM - 3PM on Tuesday October 25, 2022.  Remember: Instructions that are not followed completely may result in serious medical risk,  up to and including death, or upon the discretion of your surgeon and anesthesiologist your  surgery may need to be rescheduled.     _X__ 1. Do not eat food after midnight the night before your procedure.                 No chewing gum or hard candies. You may drink clear liquids up to 2 hours                 before you are scheduled to arrive for your surgery- DO not drink clear                 liquids within 2 hours of the start of your surgery.                 Clear Liquids include:  water, apple juice without pulp, clear Gatorade, G2 or                  Gatorade Zero (avoid Red/Purple/Blue), Black Coffee or Tea (Do not add                 anything to coffee or tea).  __X__2.  On the morning of surgery brush your teeth with toothpaste and water, you                may rinse your mouth with mouthwash if you wish.  Do not swallow any toothpaste or mouthwash.     _X__ 3.  No Alcohol for 24 hours before or after surgery.   _X__ 4.  Do Not Smoke or use e-cigarettes For 24 Hours Prior to Your Surgery.                 Do not use any chewable tobacco products for at least 6 hours prior to                 Surgery.  _X__  5.  Do not use any recreational drugs (marijuana, cocaine, heroin, ecstasy, MDMA or other)                For at least one week prior to your surgery.  Combination of these drugs with anesthesia                May have life threatening results.  ____  6.  Bring all medications with you on the day of surgery if instructed.   __X__  7.  Notify your doctor if there is any change in your medical  condition      (cold, fever, infections).     Do not wear jewelry, make-up, hairpins, clips or nail polish. Do not wear lotions, powders, or perfumes or deodorant. Do not shave 48 hours prior to surgery. Men may shave face and neck. Do not bring valuables to the hospital.    [facility] is not responsible for any belongings or valuables.  Contacts, dentures or bridgework may not be worn into surgery. Leave your suitcase in the car. After surgery it may be brought to your room. For patients admitted to the hospital, discharge time is determined by your  treatment team.   Patients discharged the day of surgery will not be allowed to drive home.   Make arrangements for someone to be with you for the first 24 hours of your Same Day Discharge.   __X__ Take these medicines the morning of surgery with A SIP OF WATER:    1. None   2.   3.   4.  5.  6.  ____ Fleet Enema (as directed)   __X__ Use CHG Soap (or wipes) as directed  ____ Use Benzoyl Peroxide Gel as instructed  ____ Use inhalers on the day of surgery  ____ Stop metformin 2 days prior to surgery    ____ Take 1/2 of usual insulin dose the night before surgery. No insulin the morning          of surgery.   ____ Call your PCP, cardiologist, or Pulmonologist if taking Coumadin/Plavix/aspirin and ask when to stop before your surgery.   __X__ One Week prior to surgery- Stop Anti-inflammatories such as Ibuprofen, Aleve, Advil, Motrin, meloxicam (MOBIC), diclofenac, etodolac, ketorolac, Toradol, Daypro, piroxicam, Goody's or BC powders. OK TO USE TYLENOL IF NEEDED   __X__ Do not start any new vitamins and or supplements until after surgery.    ____ Bring C-Pap to the hospital.    If you have any questions regarding your pre-procedure instructions,  Please call Pre-admit Testing at [phone]

## 2022-10-26 ENCOUNTER — Encounter
Admission: RE | Disposition: A | Discharge status: Home / Self Care | Payer: Self-pay | Source: Home / Self Care | Attending: Surgery

## 2022-10-26 ENCOUNTER — Ambulatory Visit
Admission: RE | Admit: 2022-10-26 | Discharge: 2022-10-26 | Disposition: A | Discharge status: Home / Self Care | Payer: BC Managed Care – PPO | Attending: Surgery | Admitting: Surgery

## 2022-10-26 ENCOUNTER — Encounter: Payer: Self-pay | Admitting: Surgery

## 2022-10-26 ENCOUNTER — Ambulatory Visit: Payer: BC Managed Care – PPO | Admitting: Urgent Care

## 2022-10-26 ENCOUNTER — Ambulatory Visit: Payer: BC Managed Care – PPO | Admitting: Certified Registered"

## 2022-10-26 ENCOUNTER — Other Ambulatory Visit: Payer: Self-pay

## 2022-10-26 DIAGNOSIS — I10 Essential (primary) hypertension: Secondary | ICD-10-CM | POA: Insufficient documentation

## 2022-10-26 DIAGNOSIS — L720 Epidermal cyst: Secondary | ICD-10-CM

## 2022-10-26 HISTORY — PX: CYST EXCISION: SHX5701

## 2022-10-26 HISTORY — PX: BREAST EXCISIONAL BIOPSY: SUR124

## 2022-10-26 SURGERY — CYST REMOVAL
Anesthesia: General | Site: Axilla | Laterality: Right

## 2022-10-26 MED ORDER — PHENYLEPHRINE 80 MCG/ML (10ML) SYRINGE FOR IV PUSH (FOR BLOOD PRESSURE SUPPORT)
PREFILLED_SYRINGE | INTRAVENOUS | Status: AC
  Filled 2022-10-26: qty 10

## 2022-10-26 MED ORDER — ORAL CARE MOUTH RINSE: 15.0000 mL | Freq: Once | OROMUCOSAL | Status: AC

## 2022-10-26 MED ORDER — BUPIVACAINE-EPINEPHRINE (PF) 0.5% -1:200000 IJ SOLN
INTRAMUSCULAR | Status: AC
  Filled 2022-10-26: qty 30

## 2022-10-26 MED ORDER — FENTANYL CITRATE (PF) 100 MCG/2ML IJ SOLN: 25.0000 ug | INTRAMUSCULAR | Status: DC | PRN

## 2022-10-26 MED ORDER — DOCUSATE SODIUM 100 MG PO CAPS: 100.0000 mg | ORAL_CAPSULE | Freq: Two times a day (BID) | ORAL | 0 refills | Status: AC | PRN

## 2022-10-26 MED ORDER — 0.9 % SODIUM CHLORIDE (POUR BTL) OPTIME
TOPICAL | Status: DC | PRN
  Administered 2022-10-26: 250 mL

## 2022-10-26 MED ORDER — IBUPROFEN 800 MG PO TABS: 800.0000 mg | ORAL_TABLET | Freq: Three times a day (TID) | ORAL | 0 refills | Status: AC | PRN

## 2022-10-26 MED ORDER — LIDOCAINE HCL (PF) 1 % IJ SOLN
INTRAMUSCULAR | Status: DC | PRN
  Administered 2022-10-26: 10 mL

## 2022-10-26 MED ORDER — OXYCODONE HCL 5 MG PO TABS: 5.0000 mg | ORAL_TABLET | Freq: Once | ORAL | Status: DC | PRN

## 2022-10-26 MED ORDER — LIDOCAINE HCL (PF) 1 % IJ SOLN
INTRAMUSCULAR | Status: AC
  Filled 2022-10-26: qty 30

## 2022-10-26 MED ORDER — CHLORHEXIDINE GLUCONATE 0.12 % MT SOLN: 15.0000 mL | Freq: Once | OROMUCOSAL | Status: AC

## 2022-10-26 MED ORDER — PHENYLEPHRINE HCL (PRESSORS) 10 MG/ML IV SOLN
INTRAVENOUS | Status: DC | PRN
  Administered 2022-10-26 (×3): 80 ug via INTRAVENOUS

## 2022-10-26 MED ORDER — ONDANSETRON HCL 4 MG/2ML IJ SOLN
INTRAMUSCULAR | Status: DC | PRN
  Administered 2022-10-26: 4 mg via INTRAVENOUS

## 2022-10-26 MED ORDER — DEXAMETHASONE SODIUM PHOSPHATE 10 MG/ML IJ SOLN
INTRAMUSCULAR | Status: DC | PRN
  Administered 2022-10-26: 10 mg via INTRAVENOUS

## 2022-10-26 MED ORDER — CHLORHEXIDINE GLUCONATE CLOTH 2 % EX PADS: 6.0000 | MEDICATED_PAD | Freq: Once | CUTANEOUS | Status: DC

## 2022-10-26 MED ORDER — MIDAZOLAM HCL 2 MG/2ML IJ SOLN
INTRAMUSCULAR | Status: DC | PRN
  Administered 2022-10-26: 2 mg via INTRAVENOUS

## 2022-10-26 MED ORDER — ONDANSETRON HCL 4 MG/2ML IJ SOLN: 4.0000 mg | Freq: Once | INTRAMUSCULAR | Status: DC | PRN

## 2022-10-26 MED ORDER — OXYCODONE-ACETAMINOPHEN 5-325 MG PO TABS
ORAL_TABLET | ORAL | Status: AC
  Filled 2022-10-26: qty 1

## 2022-10-26 MED ORDER — MIDAZOLAM HCL 2 MG/2ML IJ SOLN
INTRAMUSCULAR | Status: AC
  Filled 2022-10-26: qty 2

## 2022-10-26 MED ORDER — OXYCODONE HCL 5 MG/5ML PO SOLN: 5.0000 mg | Freq: Once | ORAL | Status: DC | PRN

## 2022-10-26 MED ORDER — KETOROLAC TROMETHAMINE 30 MG/ML IJ SOLN
INTRAMUSCULAR | Status: DC | PRN
  Administered 2022-10-26: 30 mg via INTRAVENOUS

## 2022-10-26 MED ORDER — OXYCODONE-ACETAMINOPHEN 5-325 MG PO TABS
1.0000 | ORAL_TABLET | Freq: Three times a day (TID) | ORAL | Status: DC | PRN
  Administered 2022-10-26: 1 via ORAL

## 2022-10-26 MED ORDER — PROPOFOL 10 MG/ML IV BOLUS
INTRAVENOUS | Status: AC
  Filled 2022-10-26: qty 20

## 2022-10-26 MED ORDER — LACTATED RINGERS IV SOLN: INTRAVENOUS | Status: DC | PRN

## 2022-10-26 MED ORDER — LIDOCAINE HCL (PF) 2 % IJ SOLN
INTRAMUSCULAR | Status: AC
  Filled 2022-10-26: qty 5

## 2022-10-26 MED ORDER — LACTATED RINGERS IV SOLN: INTRAVENOUS | Status: DC

## 2022-10-26 MED ORDER — PROPOFOL 10 MG/ML IV BOLUS
INTRAVENOUS | Status: DC | PRN
  Administered 2022-10-26: 150 mg via INTRAVENOUS

## 2022-10-26 MED ORDER — LIDOCAINE HCL (CARDIAC) PF 100 MG/5ML IV SOSY
PREFILLED_SYRINGE | INTRAVENOUS | Status: DC | PRN
  Administered 2022-10-26: 100 mg via INTRAVENOUS

## 2022-10-26 MED ORDER — ACETAMINOPHEN 10 MG/ML IV SOLN: 1000.0000 mg | Freq: Once | INTRAVENOUS | Status: DC | PRN

## 2022-10-26 MED ORDER — CEFAZOLIN SODIUM-DEXTROSE 2-4 GM/100ML-% IV SOLN
INTRAVENOUS | Status: AC
  Filled 2022-10-26: qty 100

## 2022-10-26 MED ORDER — FENTANYL CITRATE (PF) 100 MCG/2ML IJ SOLN
INTRAMUSCULAR | Status: AC
  Filled 2022-10-26: qty 2

## 2022-10-26 MED ORDER — OXYCODONE-ACETAMINOPHEN 5-325 MG PO TABS: 1.0000 | ORAL_TABLET | Freq: Three times a day (TID) | ORAL | 0 refills | Status: AC | PRN

## 2022-10-26 MED ORDER — EPHEDRINE SULFATE (PRESSORS) 50 MG/ML IJ SOLN
INTRAMUSCULAR | Status: DC | PRN
  Administered 2022-10-26: 10 mg via INTRAVENOUS

## 2022-10-26 MED ORDER — ONDANSETRON HCL 4 MG/2ML IJ SOLN
INTRAMUSCULAR | Status: AC
  Filled 2022-10-26: qty 2

## 2022-10-26 MED ORDER — FAMOTIDINE 20 MG PO TABS
ORAL_TABLET | ORAL | Status: AC
  Administered 2022-10-26: 20 mg via ORAL
  Filled 2022-10-26: qty 1

## 2022-10-26 MED ORDER — FENTANYL CITRATE (PF) 100 MCG/2ML IJ SOLN
INTRAMUSCULAR | Status: DC | PRN
  Administered 2022-10-26 (×2): 50 ug via INTRAVENOUS

## 2022-10-26 MED ORDER — ACETAMINOPHEN 325 MG PO TABS: 650.0000 mg | ORAL_TABLET | Freq: Three times a day (TID) | ORAL | 0 refills | Status: AC | PRN

## 2022-10-26 MED ORDER — CHLORHEXIDINE GLUCONATE 0.12 % MT SOLN
OROMUCOSAL | Status: AC
  Administered 2022-10-26: 15 mL via OROMUCOSAL
  Filled 2022-10-26: qty 15

## 2022-10-26 MED ORDER — FAMOTIDINE 20 MG PO TABS: 20.0000 mg | ORAL_TABLET | Freq: Once | ORAL | Status: AC

## 2022-10-26 MED ORDER — DEXAMETHASONE SODIUM PHOSPHATE 10 MG/ML IJ SOLN
INTRAMUSCULAR | Status: AC
  Filled 2022-10-26: qty 1

## 2022-10-26 MED ORDER — CEFAZOLIN SODIUM-DEXTROSE 2-4 GM/100ML-% IV SOLN
2.0000 g | INTRAVENOUS | Status: AC
  Administered 2022-10-26: 2 g via INTRAVENOUS

## 2022-10-26 SURGICAL SUPPLY — 32 items
BLADE SURG 15 STRL LF DISP TIS (BLADE) ×1 IMPLANT
BLADE SURG 15 STRL SS (BLADE) ×1
CHLORAPREP W/TINT 26 (MISCELLANEOUS) ×1 IMPLANT
DERMABOND ADVANCED .7 DNX12 (GAUZE/BANDAGES/DRESSINGS) ×1 IMPLANT
DRAPE 3/4 80X56 (DRAPES) ×1 IMPLANT
DRAPE LAPAROTOMY 100X77 ABD (DRAPES) ×1 IMPLANT
ELECT CAUTERY BLADE 6.4 (BLADE) ×1 IMPLANT
ELECT REM PT RETURN 9FT ADLT (ELECTROSURGICAL) ×1
ELECTRODE REM PT RTRN 9FT ADLT (ELECTROSURGICAL) ×1 IMPLANT
GAUZE 4X4 16PLY ~~LOC~~+RFID DBL (SPONGE) ×1 IMPLANT
GLOVE BIOGEL PI IND STRL 7.0 (GLOVE) ×1 IMPLANT
GLOVE SURG SYN 6.5 ES PF (GLOVE) ×1 IMPLANT
GLOVE SURG SYN 6.5 PF PI (GLOVE) ×1 IMPLANT
GOWN STRL REUS W/ TWL LRG LVL3 (GOWN DISPOSABLE) ×2 IMPLANT
GOWN STRL REUS W/TWL LRG LVL3 (GOWN DISPOSABLE) ×2
KIT TURNOVER KIT A (KITS) ×1 IMPLANT
LABEL OR SOLS (LABEL) ×1 IMPLANT
MANIFOLD NEPTUNE II (INSTRUMENTS) ×1 IMPLANT
NEEDLE HYPO 22GX1.5 SAFETY (NEEDLE) ×1 IMPLANT
NS IRRIG 1000ML POUR BTL (IV SOLUTION) ×1 IMPLANT
PACK BASIN MINOR ARMC (MISCELLANEOUS) ×1 IMPLANT
SUT ETHILON 3-0 FS-10 30 BLK (SUTURE)
SUT MNCRL 4-0 (SUTURE) ×1
SUT MNCRL 4-0 27XMFL (SUTURE) ×1
SUT VIC AB 3-0 SH 27 (SUTURE) ×1
SUT VIC AB 3-0 SH 27X BRD (SUTURE) ×1 IMPLANT
SUTURE EHLN 3-0 FS-10 30 BLK (SUTURE) IMPLANT
SUTURE MNCRL 4-0 27XMF (SUTURE) ×1 IMPLANT
SYR 30ML LL (SYRINGE) ×1 IMPLANT
TOWEL OR 17X26 4PK STRL BLUE (TOWEL DISPOSABLE) ×1 IMPLANT
TRAP FLUID SMOKE EVACUATOR (MISCELLANEOUS) ×1 IMPLANT
WATER STERILE IRR 500ML POUR (IV SOLUTION) ×1 IMPLANT

## 2022-10-26 NOTE — Op Note (Addendum)
Pre-Op Dx: epidmeral cyst Post-Op Dx: same Anesthesia: LMA EBL: 16XW Complications:  none apparent Specimen: epidermal cyst excision Procedure: excisional biopsy of epidermal cyst Surgeon: Lysle Pearl  Indications for procedure: See H&P  Description of Procedure:  Consent obtained, time out performed.  Patient placed in supine position.  Area sterilized and draped in usual position.  Local infused to area previously marked.  7cm incision made through dermis with 15blade and cyst noted in subcutaneous layer.  The  4cm x 7cm x 3cm cyst then removed from surrounding tissue completely using electrocautery, passed off field pending pathology.  Wound hemostasis noted, then closed in two layer fashion with 3-0 vicryl in interrupted fashion for deep dermal layer, then running 4-0 monocryl in subcuticular fashion for epidermal layer.  Wound then dressed with dermabond.  Pt tolerated procedure well, and transferred to PACU in stable condition. Sponge and instrument count correct at end of procedure.

## 2022-10-26 NOTE — Discharge Instructions (Addendum)
Removal, Care After This sheet gives you information about how to care for yourself after your procedure. Your health care provider may also give you more specific instructions. If you have problems or questions, contact your health care provider. What can I expect after the procedure? After the procedure, it is common to have: Soreness. Bruising. Itching. Follow these instructions at home: site care Follow instructions from your health care provider about how to take care of your site. Make sure you: Wash your hands with soap and water before and after you change your bandage (dressing). If soap and water are not available, use hand sanitizer. Leave stitches (sutures), skin glue, or adhesive strips in place. These skin closures may need to stay in place for 2 weeks or longer. If adhesive strip edges start to loosen and curl up, you may trim the loose edges. Do not remove adhesive strips completely unless your health care provider tells you to do that. If the area bleeds or bruises, apply gentle pressure for 10 minutes. OK TO SHOWER IN 24HRS  Check your site every day for signs of infection. Check for: Redness, swelling, or pain. Fluid or blood. Warmth. Pus or a bad smell.  General instructions Rest and then return to your normal activities as told by your health care provider.  tylenol and advil as needed for discomfort.  Please alternate between the two every four hours as needed for pain.    Use narcotics, if prescribed, only when tylenol and motrin is not enough to control pain.  325-650mg every 8hrs to max of 3000mg/24hrs (including the 325mg in every norco dose) for the tylenol.    Advil up to 800mg per dose every 8hrs as needed for pain.   Keep all follow-up visits as told by your health care provider. This is important. Contact a health care provider if: You have redness, swelling, or pain around your site. You have fluid or blood coming from your site. Your site feels warm to  the touch. You have pus or a bad smell coming from your site. You have a fever. Your sutures, skin glue, or adhesive strips loosen or come off sooner than expected. Get help right away if: You have bleeding that does not stop with pressure or a dressing. Summary After the procedure, it is common to have some soreness, bruising, and itching at the site. Follow instructions from your health care provider about how to take care of your site. Check your site every day for signs of infection. Contact a health care provider if you have redness, swelling, or pain around your site, or your site feels warm to the touch. Keep all follow-up visits as told by your health care provider. This is important. This information is not intended to replace advice given to you by your health care provider. Make sure you discuss any questions you have with your health care provider. Document Released: 10/16/2015 Document Revised: 03/19/2018 Document Reviewed: 03/19/2018 Elsevier Interactive Patient Education  2019 [employer]  AMBULATORY SURGERY  DISCHARGE INSTRUCTIONS   The drugs that you were given will stay in your system until tomorrow so for the next 24 hours you should not:  Drive an automobile Make any legal decisions Drink any alcoholic beverage   You may resume regular meals tomorrow.  Today it is better to start with liquids and gradually work up to solid foods.  You may eat anything you prefer, but it is better to start with liquids, then soup and crackers, and gradually   work up to solid foods.   Please notify your doctor immediately if you have any unusual bleeding, trouble breathing, redness and pain at the surgery site, drainage, fever, or pain not relieved by medication.    Additional Instructions:        Please contact your physician with any problems or Same Day Surgery at [phone], Monday through Friday 6 am to 4 pm, or [place] [facility] [place] Main number at  [phone]. 

## 2022-10-26 NOTE — Transfer of Care (Signed)
Immediate Anesthesia Transfer of Care Note  Patient: Leah Rush  Procedure(s) Performed: CYST REMOVAL (Right: Axilla)  Patient Location: PACU  Anesthesia Type:General  Level of Consciousness: drowsy  Airway & Oxygen Therapy: Patient Spontanous Breathing  Post-op Assessment: Report given to RN and Post -op Vital signs reviewed and stable  Post vital signs: Reviewed and stable  Last Vitals:  Vitals Value Taken Time  BP 139/93 10/26/22 0915  Temp 35.8 0912  Pulse 74 10/26/22 0917  Resp 14 10/26/22 0917  SpO2 100 % 10/26/22 0917  Vitals shown include unvalidated device data.  Last Pain:  Vitals:   10/26/22 0737  TempSrc: Oral  PainSc: 1          Complications: No notable events documented.

## 2022-10-26 NOTE — Anesthesia Procedure Notes (Signed)
Procedure Name: LMA Insertion Date/Time: 10/26/2022 8:25 AM  Performed by: Cammie Sickle, CRNAPre-anesthesia Checklist: Patient identified, Patient being monitored, Timeout performed, Emergency Drugs available and Suction available Patient Re-evaluated:Patient Re-evaluated prior to induction Oxygen Delivery Method: Circle system utilized Preoxygenation: Pre-oxygenation with 100% oxygen Induction Type: IV induction Ventilation: Mask ventilation without difficulty LMA: LMA inserted LMA Size: 4.0 Tube type: Oral Number of attempts: 1 Placement Confirmation: positive ETCO2 and breath sounds checked- equal and bilateral Tube secured with: Tape Dental Injury: Teeth and Oropharynx as per pre-operative assessment

## 2022-10-26 NOTE — Anesthesia Postprocedure Evaluation (Signed)
Anesthesia Post Note  Patient: Leah Rush  Procedure(s) Performed: CYST REMOVAL (Right: Axilla)  Patient location during evaluation: PACU Anesthesia Type: General Level of consciousness: awake and alert Pain management: pain level controlled Vital Signs Assessment: post-procedure vital signs reviewed and stable Respiratory status: spontaneous breathing, nonlabored ventilation, respiratory function stable and patient connected to nasal cannula oxygen Cardiovascular status: blood pressure returned to baseline and stable Postop Assessment: no apparent nausea or vomiting Anesthetic complications: no   No notable events documented.   Last Vitals:  Vitals:   10/26/22 0930 10/26/22 1002  BP: (!) 146/88 (!) 142/98  Pulse: 72 68  Resp: 18 16  Temp:  (!) 36.1 C  SpO2: 100% 100%    Last Pain:  Vitals:   10/26/22 1002  TempSrc: Temporal  PainSc: 2                  Arita Miss

## 2022-10-26 NOTE — Anesthesia Preprocedure Evaluation (Signed)
Anesthesia Evaluation  Patient identified by MRN, date of birth, ID band Patient awake    Reviewed: Allergy & Precautions, NPO status , Patient's Chart, lab work & pertinent test results  History of Anesthesia Complications Negative for: history of anesthetic complications  Airway Mallampati: II  TM Distance: >3 FB Neck ROM: Full    Dental no notable dental hx. (+) Teeth Intact   Pulmonary neg pulmonary ROS, neg sleep apnea, neg COPD, Patient abstained from smoking.Not current smoker   Pulmonary exam normal breath sounds clear to auscultation       Cardiovascular Exercise Tolerance: Good METShypertension, Pt. on medications (-) CAD and (-) Past MI (-) dysrhythmias  Rhythm:Regular Rate:Normal - Systolic murmurs    Neuro/Psych negative neurological ROS  negative psych ROS   GI/Hepatic ,neg GERD  ,,(+)     (-) substance abuse    Endo/Other  neg diabetes    Renal/GU negative Renal ROS     Musculoskeletal   Abdominal   Peds  Hematology   Anesthesia Other Findings Past Medical History: No date: Hypertension  Reproductive/Obstetrics                             Anesthesia Physical Anesthesia Plan  ASA: 2  Anesthesia Plan: General   Post-op Pain Management: Ofirmev IV (intra-op)* and Toradol IV (intra-op)*   Induction: Intravenous  PONV Risk Score and Plan: 3 and Ondansetron, Dexamethasone and Midazolam  Airway Management Planned: LMA  Additional Equipment: None  Intra-op Plan:   Post-operative Plan: Extubation in OR  Informed Consent: I have reviewed the patients History and Physical, chart, labs and discussed the procedure including the risks, benefits and alternatives for the proposed anesthesia with the patient or authorized representative who has indicated his/her understanding and acceptance.     Dental advisory given  Plan Discussed with: CRNA and Surgeon  Anesthesia  Plan Comments: (Discussed risks of anesthesia with patient, including PONV, sore throat, lip/dental/eye damage. Rare risks discussed as well, such as cardiorespiratory and neurological sequelae, and allergic reactions. Discussed the role of CRNA in patient's perioperative care. Patient understands.)       Anesthesia Quick Evaluation

## 2022-10-26 NOTE — Interval H&P Note (Signed)
No change. OK to proceed.

## 2022-10-27 ENCOUNTER — Encounter: Payer: Self-pay | Admitting: Surgery

## 2022-10-27 LAB — SURGICAL PATHOLOGY

## 2022-11-01 IMAGING — MG MM DIGITAL SCREENING BILAT W/ TOMO AND CAD
8 series · 8 of 24 positions shown · non-contrast
Comparison: Previous exam(s).

CLINICAL DATA: Screening.

EXAM:
DIGITAL SCREENING BILATERAL MAMMOGRAM WITH TOMOSYNTHESIS AND CAD
TECHNIQUE: Bilateral screening digital craniocaudal and mediolateral oblique
mammograms were obtained. Bilateral screening digital breast
tomosynthesis was performed. The images were evaluated with
computer-aided detection.

[L CC synth-2D]
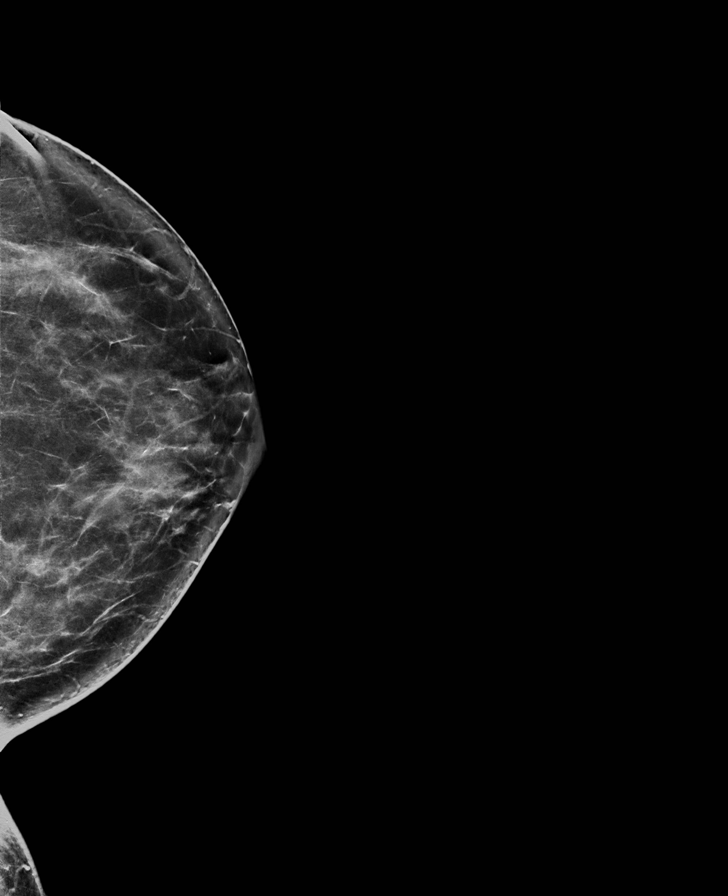

[R CC synth-2D]
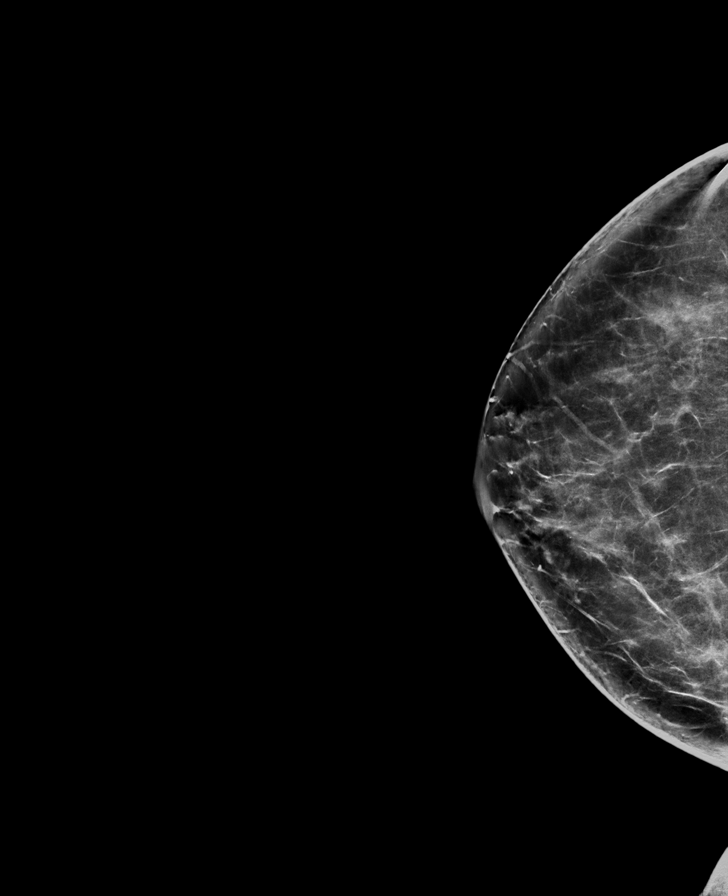

[L MLO synth-2D]
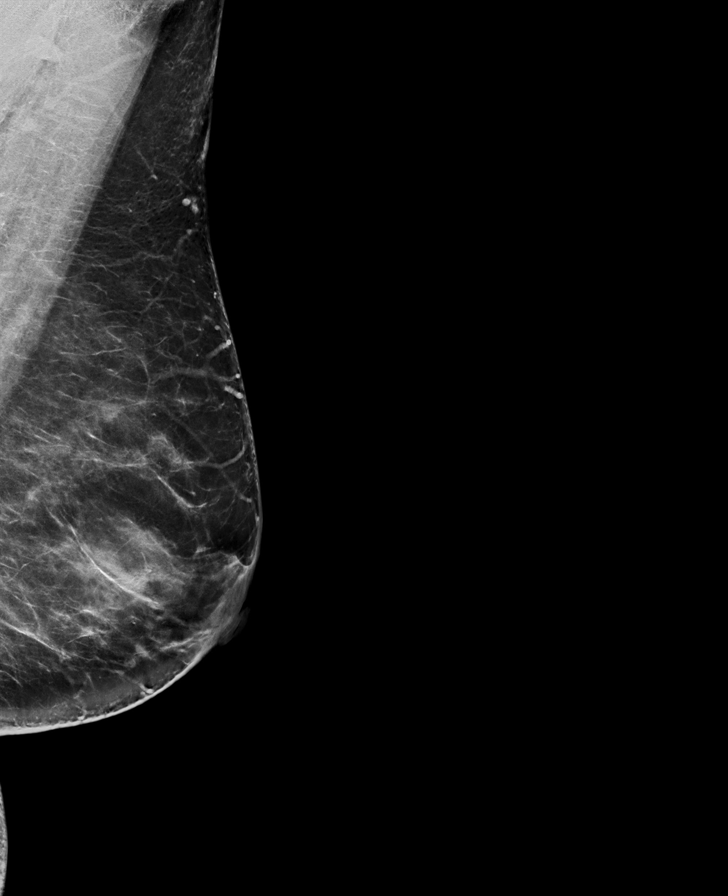

[R MLO synth-2D]
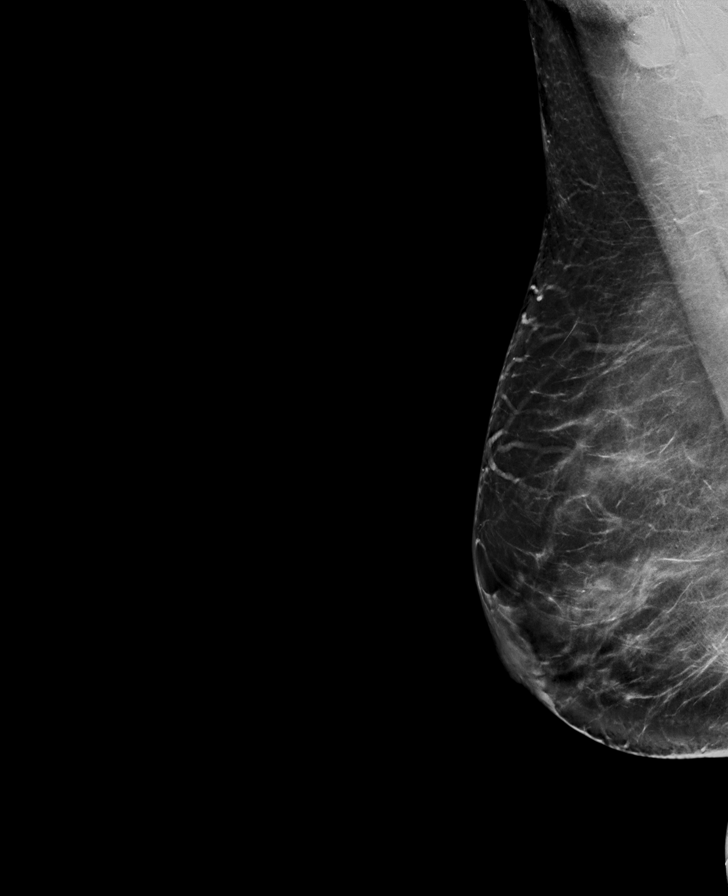

[L CC tomo · tomo slice 41/81.0]
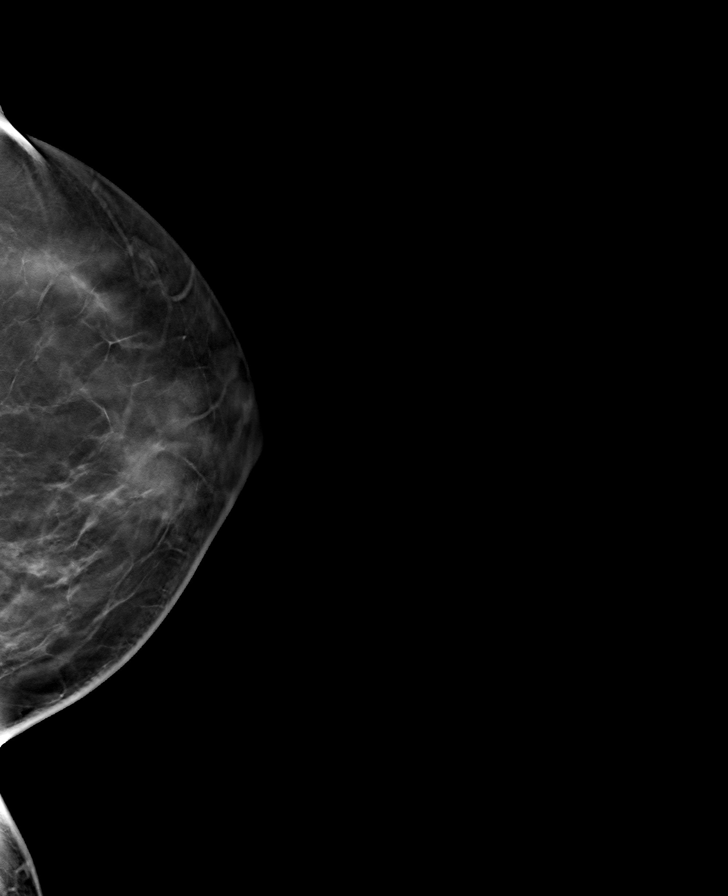

[R MLO tomo · tomo slice 47/93.0]
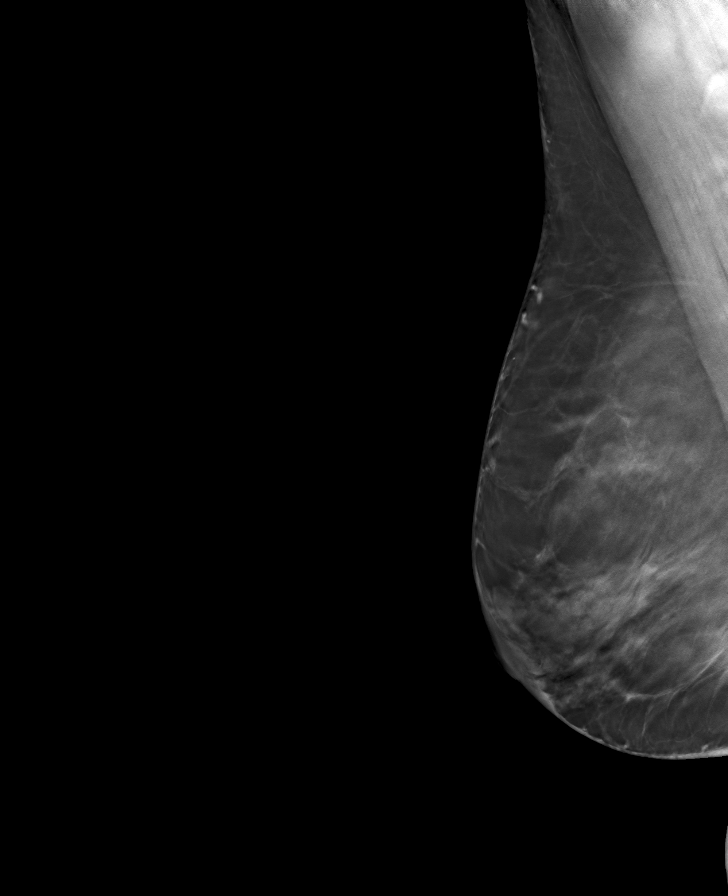

[R CC tomo · tomo slice 41/80.0]
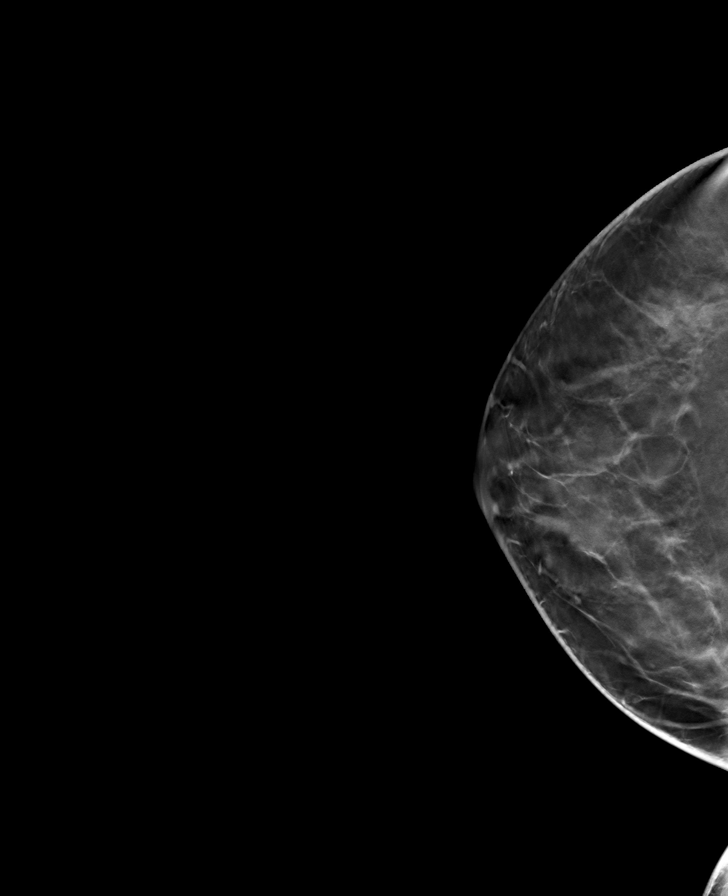

[L MLO tomo · tomo slice 45/89.0]
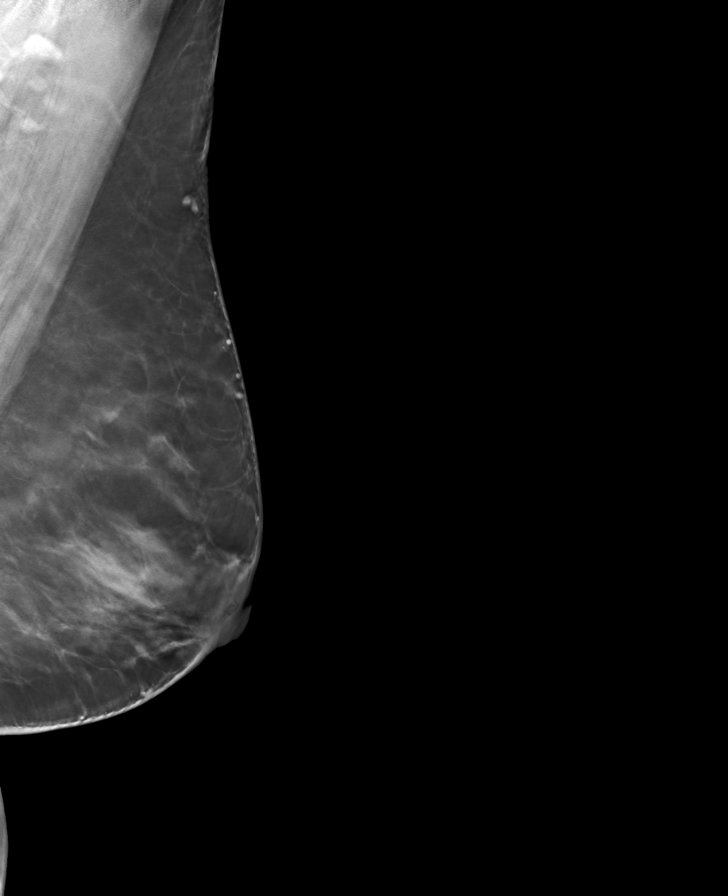

[8 of 24 positions shown; findings below may reference images not displayed]

ACR Breast Density Category c: The breast tissue is heterogeneously
dense, which may obscure small masses.
FINDINGS: In the right axilla, a possible mass warrants further evaluation. In
the left breast, no findings suspicious for malignancy.
IMPRESSION: Further evaluation is suggested for possible mass in the right
axilla.

RECOMMENDATION:
Ultrasound of the right axilla. (Code:WZ-A-[id number])

The patient will be contacted regarding the findings, and additional
imaging will be scheduled.

BI-RADS CATEGORY  0: Incomplete. Need additional imaging evaluation
and/or prior mammograms for comparison.

## 2022-11-22 IMAGING — US US AXILLARY RIGHT
2 series · 7 of 7 positions shown · non-contrast
Comparison: Previous exam(s).

CLINICAL DATA: Patient returns after screening study evaluation of
possible RIGHT axillary mass.

EXAM:
ULTRASOUND OF THE RIGHT AXILLA

[Series 1: us axillary right · 0.06mm/px · 6 of 6 slices shown (1 of 2)]
[im 1/6]
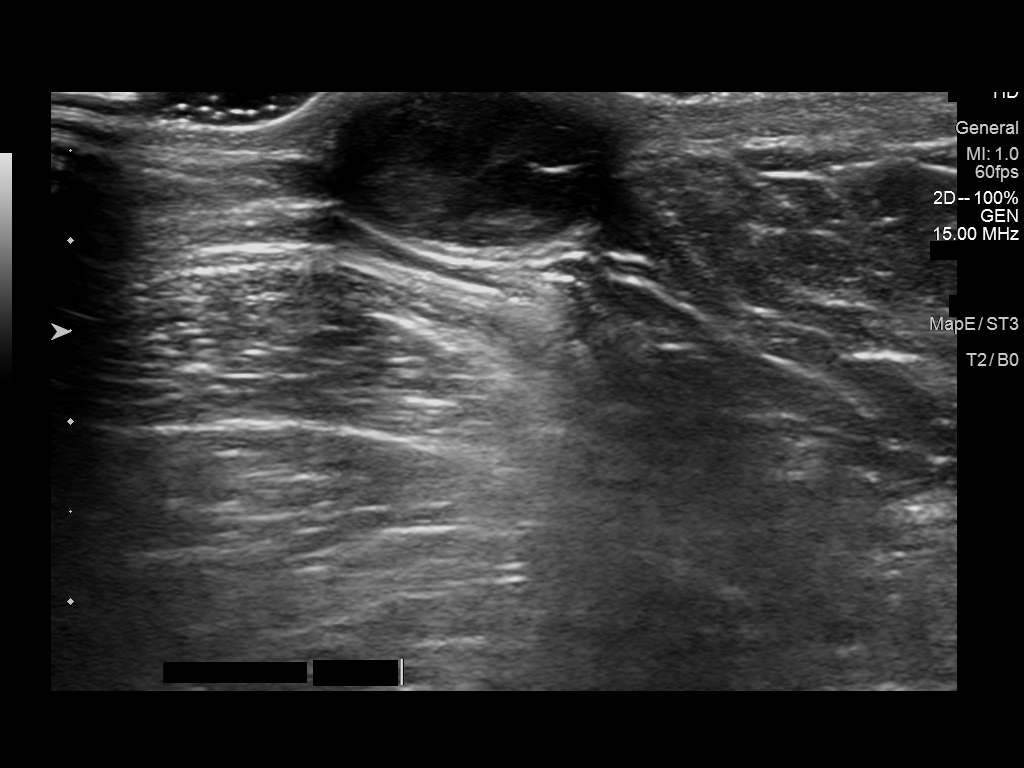
[im 2/6]
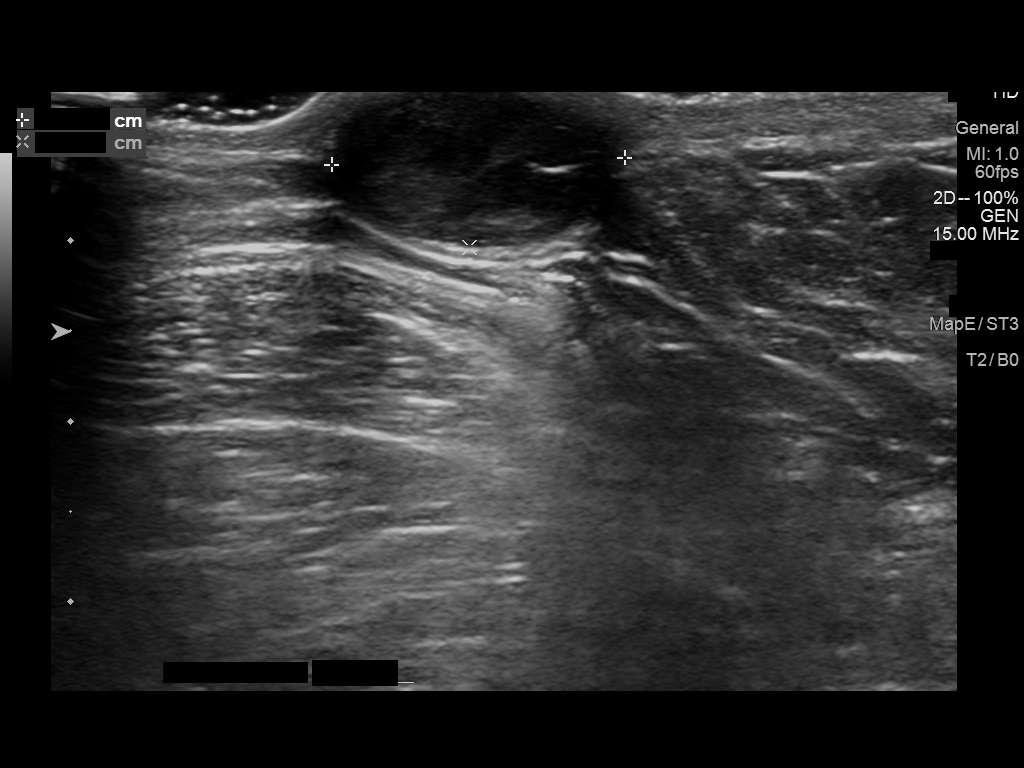
[im 3/6]
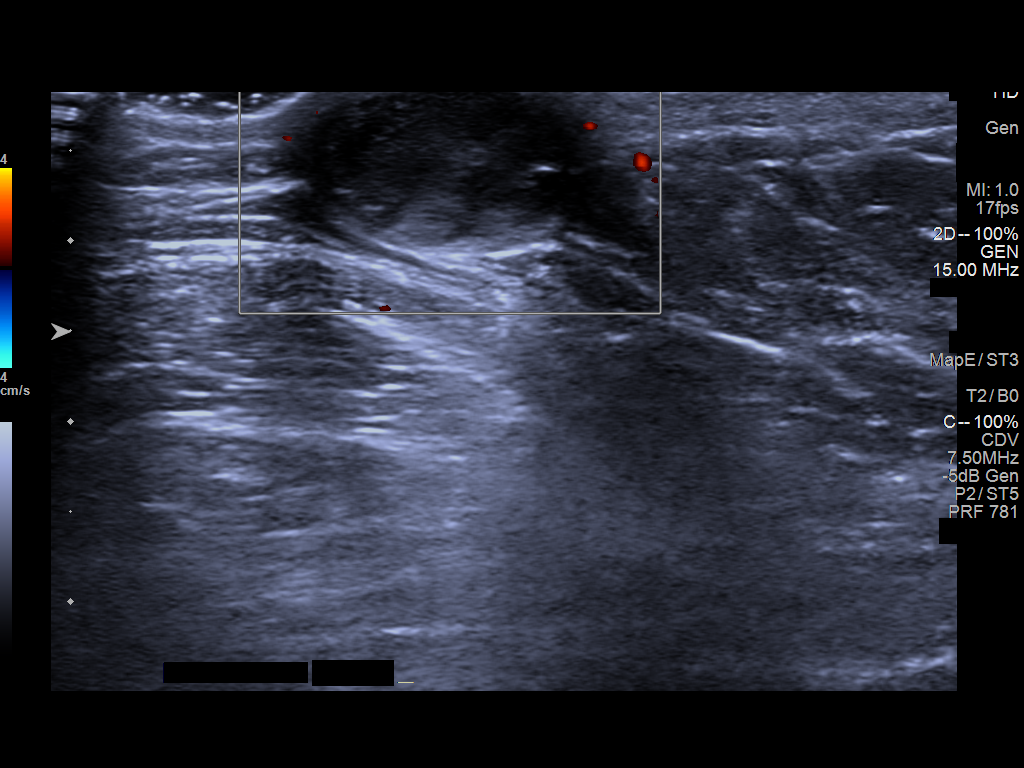
[im 4/6]
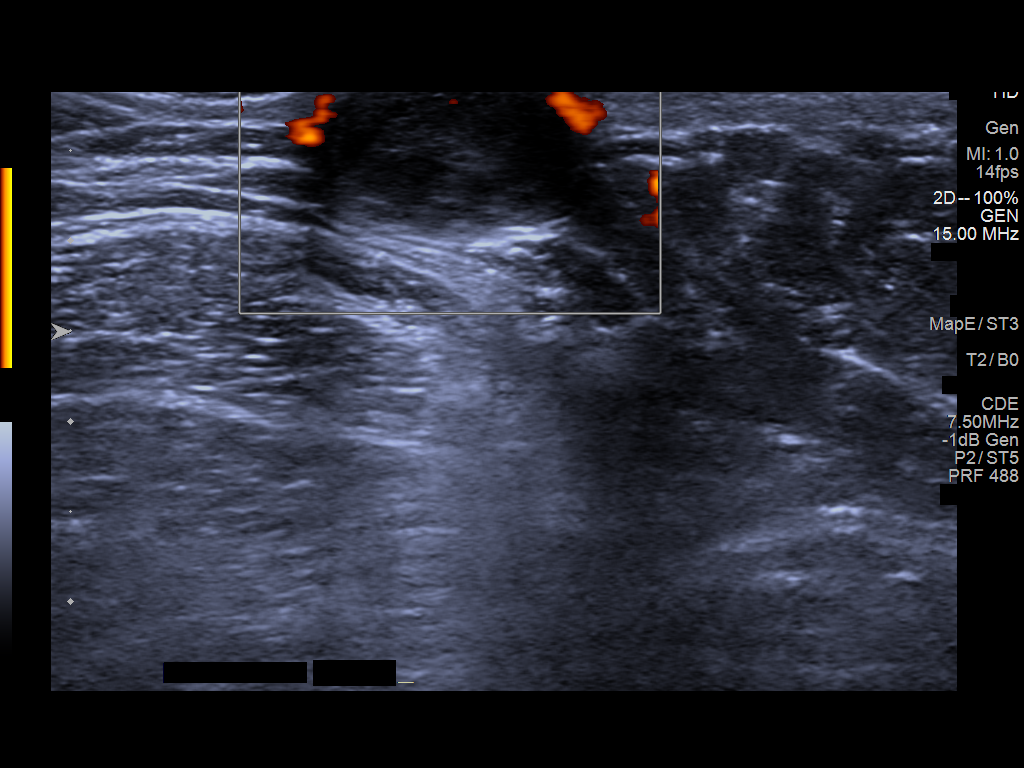
[im 5/6]
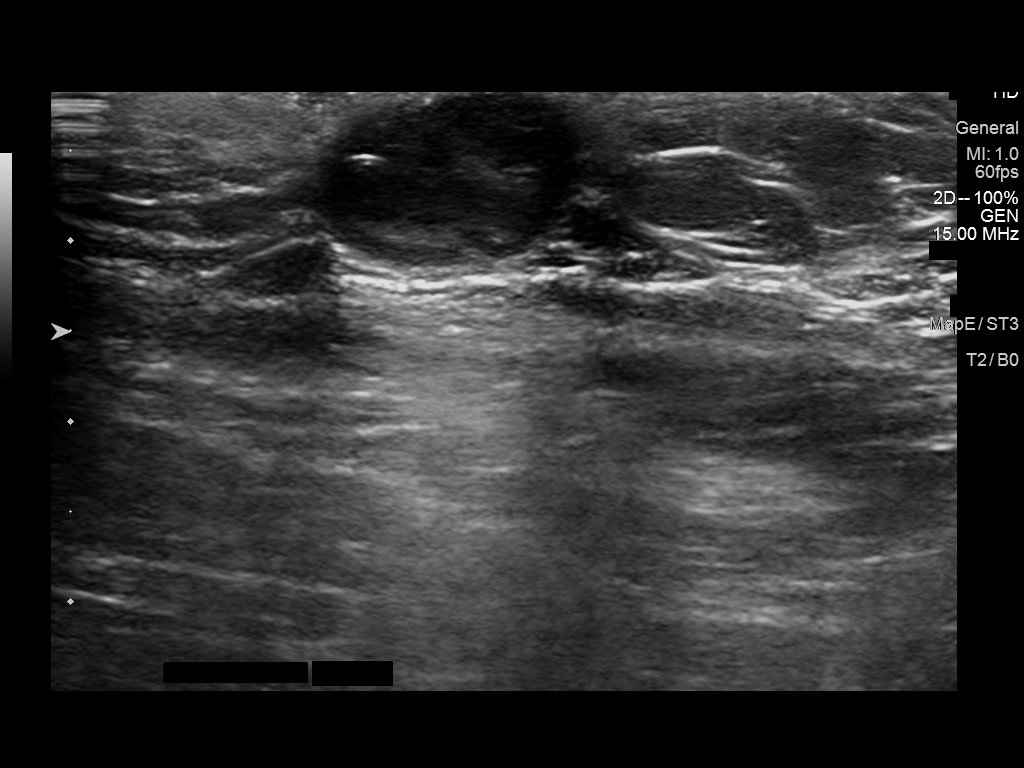
[im 6/6]
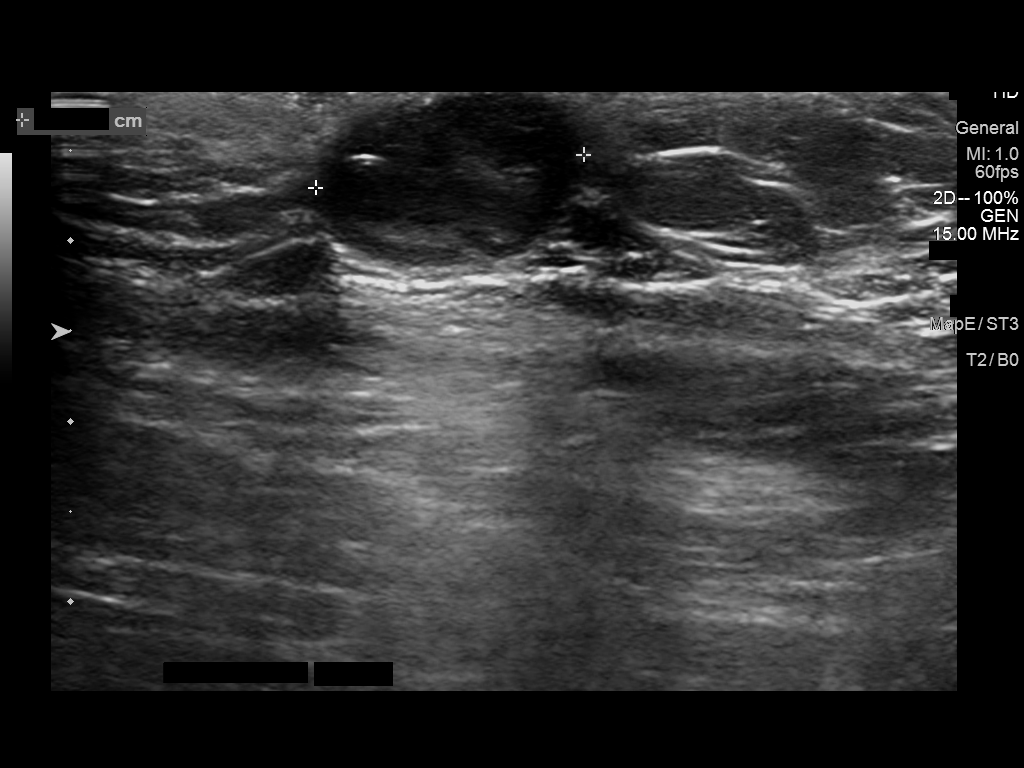

[Series 2: us axillary right · 0.05mm/px · 1 of 1 slices shown (2 of 2)]
[im 1/1]
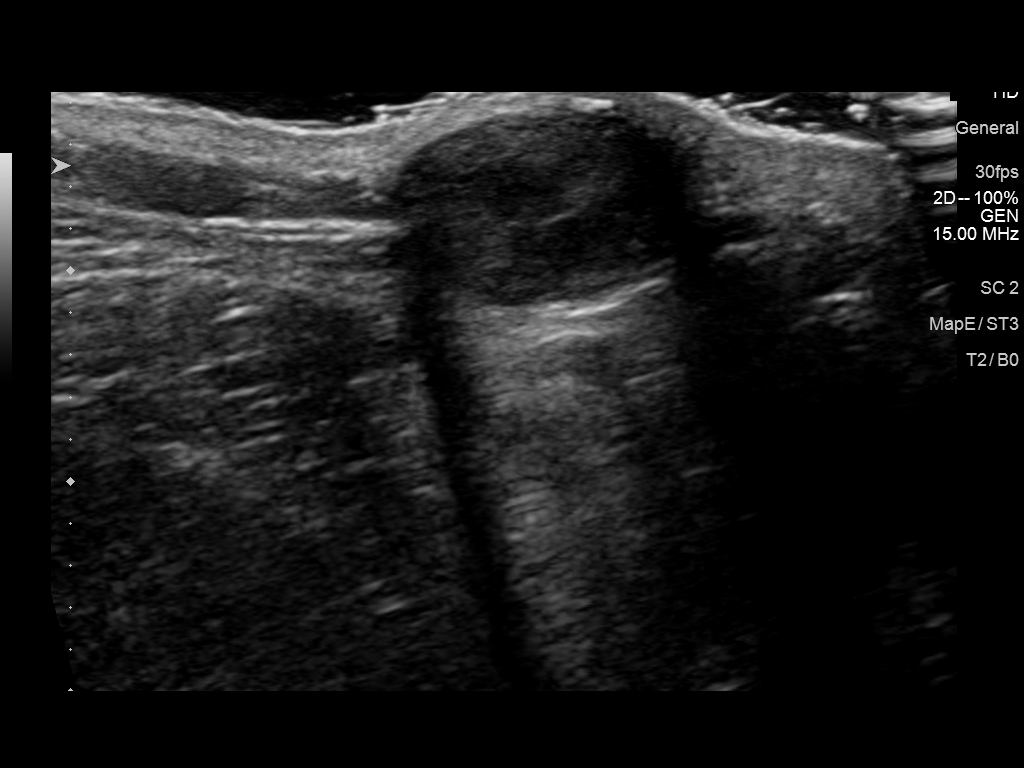

[7 of 7 positions shown; findings below may reference images not displayed]

FINDINGS: On physical exam,there is a visible raised lesion with central
punctum within a skin fold in the UPPER axilla. There is no
associated erythema. Patient reports lesion has been present for at
least years.

Ultrasound is performed, showing a hypoechoic parallel oval
circumscribed mass in the RIGHT axilla corresponding to the palpable
abnormality. Mass measures 1.6 x 0.9 x 1 5 centimeters. There is no
internal blood flow by Doppler evaluation. Findings are consistent
with intradermal inclusion cyst.
IMPRESSION: Benign intradermal inclusion cyst in the RIGHT axilla accounting for
the screening abnormality. No ultrasound evidence for malignancy.

RECOMMENDATION:
Screening mammogram in one year.(Code:25-7-SY8)

I have discussed the findings and recommendations with the patient.
If applicable, a reminder letter will be sent to the patient
regarding the next appointment.

BI-RADS CATEGORY  2: Benign.

## 2022-12-19 ENCOUNTER — Other Ambulatory Visit: Payer: Self-pay

## 2022-12-19 MED ORDER — METOPROLOL SUCCINATE ER 100 MG PO TB24
100.0000 mg | ORAL_TABLET | Freq: Every day | ORAL | 1 refills | Status: AC
  Filled 2022-12-19: qty 90, 90d supply, fill #0

## 2022-12-20 ENCOUNTER — Other Ambulatory Visit: Payer: Self-pay

## 2022-12-20 MED ORDER — SPIRONOLACTONE 25 MG PO TABS
25.0000 mg | ORAL_TABLET | Freq: Every day | ORAL | 1 refills | Status: AC
  Filled 2022-12-20: qty 90, 90d supply, fill #0
  Filled 2023-03-22: qty 90, 90d supply, fill #1

## 2022-12-23 ENCOUNTER — Other Ambulatory Visit: Payer: Self-pay

## 2023-01-10 ENCOUNTER — Other Ambulatory Visit: Payer: Self-pay

## 2023-01-10 MED ORDER — METOPROLOL SUCCINATE ER 100 MG PO TB24
100.0000 mg | ORAL_TABLET | Freq: Every day | ORAL | 0 refills | Status: AC
  Filled 2023-01-10: qty 90, 90d supply, fill #0

## 2023-01-16 ENCOUNTER — Other Ambulatory Visit: Payer: Self-pay | Admitting: Family Medicine

## 2023-01-16 DIAGNOSIS — Z1231 Encounter for screening mammogram for malignant neoplasm of breast: Secondary | ICD-10-CM

## 2023-02-15 ENCOUNTER — Ambulatory Visit
Admission: RE | Admit: 2023-02-15 | Discharge: 2023-02-15 | Disposition: A | Discharge status: Home / Self Care | Payer: BC Managed Care – PPO | Source: Ambulatory Visit | Attending: Family Medicine | Admitting: Family Medicine

## 2023-02-15 DIAGNOSIS — Z1231 Encounter for screening mammogram for malignant neoplasm of breast: Secondary | ICD-10-CM | POA: Diagnosis present

## 2023-11-24 ENCOUNTER — Other Ambulatory Visit (INDEPENDENT_AMBULATORY_CARE_PROVIDER_SITE_OTHER): Payer: BC Managed Care – PPO

## 2023-11-24 ENCOUNTER — Encounter: Payer: Self-pay | Admitting: Orthopedic Surgery

## 2023-11-24 ENCOUNTER — Ambulatory Visit: Payer: BC Managed Care – PPO | Admitting: Orthopedic Surgery

## 2023-11-24 DIAGNOSIS — M25561 Pain in right knee: Secondary | ICD-10-CM | POA: Diagnosis not present

## 2023-11-24 DIAGNOSIS — M25512 Pain in left shoulder: Secondary | ICD-10-CM

## 2023-11-24 DIAGNOSIS — M7552 Bursitis of left shoulder: Secondary | ICD-10-CM

## 2023-11-24 MED ORDER — LIDOCAINE HCL 1 % IJ SOLN
5.0000 mL | INTRAMUSCULAR | Status: AC | PRN
Start: 2023-11-24 — End: 2023-11-24
  Administered 2023-11-24: 5 mL

## 2023-11-24 MED ORDER — METHYLPREDNISOLONE ACETATE 40 MG/ML IJ SUSP
40.0000 mg | INTRAMUSCULAR | Status: AC | PRN
  Administered 2023-11-24: 40 mg via INTRA_ARTICULAR

## 2023-11-24 MED ORDER — BUPIVACAINE HCL 0.5 % IJ SOLN
9.0000 mL | INTRAMUSCULAR | Status: AC | PRN
  Administered 2023-11-24: 9 mL via INTRA_ARTICULAR

## 2023-11-24 NOTE — Progress Notes (Signed)
Office Visit Note   Patient: Leah Rush           Date of Birth: January 25, 1966           MRN: [id number] Visit Date: 11/24/2023 Requested by: Rayetta Humphrey, MD [address] [place],  [place] [address] PCP: Rayetta Humphrey, MD  Subjective: Chief Complaint  Patient presents with   Left Shoulder - Pain   Right Knee - Pain    HPI: Kevina Piloto is a 58 y.o. female who presents to the office reporting left shoulder pain and right knee pain.  Patient has had prior injection in the left subacromial space 6 years ago with good relief.  Currently the pain wakes her from sleep at night.  Denies any radiation and does report some occasional numbness and tingling but in both hands.  Right knee has been painful for 2 years with weakness giving way and popping.  She did report having an injury in the knee several months ago and reports a pop when she was squatting a friend weightlifting.  This occurred in March..                ROS: All systems reviewed are negative as they relate to the chief complaint within the history of present illness.  Patient denies fevers or chills.  Assessment & Plan: Visit Diagnoses:  1. Left shoulder pain, unspecified chronicity   2. Right knee pain, unspecified chronicity     Plan: Impression is left shoulder bursitis with a little bit of popping which is not really consistent with rotator cuff pathology.  Subacromial injection performed and we will have a low threshold for further imaging on that left shoulder.  Radiographs unremarkable.  The right knee has possible meniscal pathology based on the mechanism of injury.  Symptoms ongoing since March with mechanical symptoms and joint line tenderness.  Radiographs unremarkable.  Plan MRI right knee to evaluate meniscal tear versus chondral defect.  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  Follow-Up Instructions: No  follow-ups on file.   Orders:  Orders Placed This Encounter  Procedures   XR Shoulder Left   XR KNEE 3 VIEW RIGHT   MR Knee Right w/o contrast   No orders of the defined types were placed in this encounter.     Procedures: Large Joint Inj: L subacromial bursa on 11/24/2023 9:44 PM Indications: diagnostic evaluation and pain Details: 18 G 1.5 in needle, posterior approach  Arthrogram: No  Medications: 9 mL bupivacaine 0.5 %; 40 mg methylPREDNISolone acetate 40 MG/ML; 5 mL lidocaine 1 % Outcome: tolerated well, no immediate complications Procedure, treatment alternatives, risks and benefits explained, specific risks discussed. Consent was given by the patient. Immediately prior to procedure a time out was called to verify the correct patient, procedure, equipment, support staff and site/side marked as required. Patient was prepped and draped in the usual sterile fashion.       Clinical Data: No additional findings.  Objective: Vital Signs: There were no vitals taken for this visit.  Physical Exam:  Constitutional: Patient appears well-developed HEENT:  Head: Normocephalic Eyes:EOM are normal Neck: Normal range of motion Cardiovascular: Normal rate Pulmonary/chest: Effort normal Neurologic: Patient is alert Skin: Skin is warm Psychiatric: Patient has normal mood and affect  Ortho Exam: Ortho exam demonstrates little popping on that left-hand side with internal/external rotation at 90 degrees of  abduction but rotator cuff strength is excellent and range of motion bilaterally is 65/100/170.  Rotator cuff strength is intact infraspinatus supraspinatus and subscap muscle testing bilaterally with no AC joint tenderness.  Right knee is examined.  Collateral crucial ligaments are stable.  Full range of motion.  Trace effusion.  Extensor mechanism nontender.  Equivocal McMurray compression testing for medial and lateral compartment pathology.  Specialty Comments:  No specialty  comments available.  Imaging: XR Shoulder Left Result Date: 11/24/2023 AP outlet axillary lateral radiographs left shoulder reviewed.  No acute fracture.  Shoulder is located.  Acromiohumeral distance is normal.  No significant degenerative changes in the glenohumeral or AC joint.  Visualized lung fields clear.   XR KNEE 3 VIEW RIGHT Result Date: 11/24/2023 AP lateral merchant radiographs of the right knee are reviewed.  No acute fracture.  No dislocation.  Alignment normal.  No significant degenerative changes in the medial lateral or patellofemoral compartments.     PMFS History: There are no active problems to display for this patient.  Past Medical History:  Diagnosis Date   Hypertension     Family History  Problem Relation Age of Onset   Breast cancer Neg Hx     Past Surgical History:  Procedure Laterality Date   ABDOMINAL HYSTERECTOMY     BREAST EXCISIONAL BIOPSY Right 10/26/2022   CYST EXCISION Right 10/26/2022   Procedure: CYST REMOVAL;  Surgeon: Sung Amabile, DO;  Location: [facility] ORS;  Service: General;  Laterality: Right;   Social History   Occupational History   Not on file  Tobacco Use   Smoking status: Never   Smokeless tobacco: Never  Vaping Use   Vaping status: Never Used  Substance and Sexual Activity   Alcohol use: Not Currently   Drug use: Not Currently   Sexual activity: Not on file

## 2023-11-27 ENCOUNTER — Telehealth: Payer: Self-pay | Admitting: Orthopedic Surgery

## 2023-11-27 DIAGNOSIS — M25512 Pain in left shoulder: Secondary | ICD-10-CM

## 2023-11-27 NOTE — Telephone Encounter (Signed)
 Patient called asked if she can get an MRI done on her left shoulder as well? The number to contact patient is [phone]

## 2023-11-27 NOTE — Telephone Encounter (Signed)
 Yes for left shoulder MRI arthrogram thanks

## 2023-11-28 NOTE — Telephone Encounter (Signed)
 Order entered

## 2023-11-28 NOTE — Telephone Encounter (Signed)
 I called patient and advised.

## 2023-11-29 ENCOUNTER — Other Ambulatory Visit: Payer: Self-pay | Admitting: Orthopedic Surgery

## 2023-11-29 DIAGNOSIS — S00259A Superficial foreign body of unspecified eyelid and periocular area, initial encounter: Secondary | ICD-10-CM

## 2023-12-01 ENCOUNTER — Ambulatory Visit
Admission: RE | Admit: 2023-12-01 | Discharge: 2023-12-01 | Disposition: A | Discharge status: Home / Self Care | Payer: BC Managed Care – PPO | Source: Ambulatory Visit | Attending: Orthopedic Surgery | Admitting: Orthopedic Surgery

## 2023-12-01 DIAGNOSIS — S00259A Superficial foreign body of unspecified eyelid and periocular area, initial encounter: Secondary | ICD-10-CM

## 2023-12-16 ENCOUNTER — Ambulatory Visit
Admission: RE | Admit: 2023-12-16 | Discharge: 2023-12-16 | Disposition: A | Discharge status: Home / Self Care | Source: Ambulatory Visit | Attending: Orthopedic Surgery | Admitting: Orthopedic Surgery

## 2023-12-16 DIAGNOSIS — M25561 Pain in right knee: Secondary | ICD-10-CM

## 2023-12-20 ENCOUNTER — Ambulatory Visit
Admission: RE | Admit: 2023-12-20 | Discharge: 2023-12-20 | Disposition: A | Discharge status: Home / Self Care | Payer: BC Managed Care – PPO | Source: Ambulatory Visit | Attending: Orthopedic Surgery | Admitting: Orthopedic Surgery

## 2023-12-20 DIAGNOSIS — M25512 Pain in left shoulder: Secondary | ICD-10-CM

## 2023-12-20 MED ORDER — IOPAMIDOL (ISOVUE-M 200) INJECTION 41%
10.0000 mL | Freq: Once | INTRAMUSCULAR | Status: AC
  Administered 2023-12-20: 10 mL via INTRA_ARTICULAR

## 2024-01-01 ENCOUNTER — Ambulatory Visit: Admitting: Orthopedic Surgery

## 2024-01-01 ENCOUNTER — Encounter: Payer: Self-pay | Admitting: Orthopedic Surgery

## 2024-01-01 DIAGNOSIS — M25512 Pain in left shoulder: Secondary | ICD-10-CM

## 2024-01-01 DIAGNOSIS — M25561 Pain in right knee: Secondary | ICD-10-CM

## 2024-01-01 NOTE — Progress Notes (Unsigned)
 Office Visit Note   Patient: Leah Rush           Date of Birth: 1966-02-23           MRN: [id number] Visit Date: 01/01/2024 Requested by: Leah Humphrey, MD [address] [place],  [place] [address] PCP: Leah Humphrey, MD  Subjective: Chief Complaint  Patient presents with   Other    Scan review right knee    HPI: Leah Rush is a 58 y.o. female who presents to the office reporting right knee pain and left shoulder pain.  Since she was last seen she has had MRI scans of both.  MRI scan of the right knee shows no meniscal or ligamentous injury of the knee.  MRI scan on my review of the shoulder shows no rotator cuff tear or capsular labral pathology.  She did have a subacromial bursa injection 11/24/2023 which did not help much.  Denies any numbness and tingling or radicular symptoms in the left arm and right leg.                ROS: All systems reviewed are negative as they relate to the chief complaint within the history of present illness.  Patient denies fevers or chills.  Assessment & Plan: Visit Diagnoses:  1. Left shoulder pain, unspecified chronicity   2. Right knee pain, unspecified chronicity     Plan: Impression is no structural abnormality which is actionable in the right knee or left shoulder.  We could consider left glenohumeral joint injection versus right knee injection.  Not really hurting enough for that intervention.  She is going to try to go back to working out in a modified fashion.  Will see her back as needed.  Follow-Up Instructions: No follow-ups on file.   Orders:  No orders of the defined types were placed in this encounter.  No orders of the defined types were placed in this encounter.     Procedures: No procedures performed   Clinical Data: No additional findings.  Objective: Vital Signs: There were no vitals taken for this visit.  Physical Exam:  Constitutional: Patient appears well-developed HEENT:  Head:  Normocephalic Eyes:EOM are normal Neck: Normal range of motion Cardiovascular: Normal rate Pulmonary/chest: Effort normal Neurologic: Patient is alert Skin: Skin is warm Psychiatric: Patient has normal mood and affect  Ortho Exam: Ortho exam demonstrates full active and passive range of motion of the right knee.  No effusion.  Collateral cruciate ligaments are stable.  On the left shoulder she has excellent range of motion with good rotator cuff strength and no coarseness or grinding with internal/external rotation of the arm.  No AC joint tenderness is present.  Specialty Comments:  No specialty comments available.  Imaging: No results found.   PMFS History: There are no active problems to display for this patient.  Past Medical History:  Diagnosis Date   Hypertension     Family History  Problem Relation Age of Onset   Breast cancer Neg Hx     Past Surgical History:  Procedure Laterality Date   ABDOMINAL HYSTERECTOMY     BREAST EXCISIONAL BIOPSY Right 10/26/2022   CYST EXCISION Right 10/26/2022   Procedure: CYST REMOVAL;  Surgeon: Sung Amabile, DO;  Location: [facility] ORS;  Service: General;  Laterality: Right;   Social History   Occupational History   Not on file  Tobacco Use   Smoking status: Never   Smokeless tobacco: Never  Vaping Use   Vaping  status: Never Used  Substance and Sexual Activity   Alcohol use: Not Currently   Drug use: Not Currently   Sexual activity: Not on file

## 2024-01-31 ENCOUNTER — Other Ambulatory Visit: Payer: Self-pay | Admitting: Family Medicine

## 2024-01-31 DIAGNOSIS — Z1231 Encounter for screening mammogram for malignant neoplasm of breast: Secondary | ICD-10-CM

## 2024-02-21 ENCOUNTER — Ambulatory Visit
Admission: RE | Admit: 2024-02-21 | Discharge: 2024-02-21 | Disposition: A | Discharge status: Home / Self Care | Source: Ambulatory Visit | Attending: Family Medicine | Admitting: Family Medicine

## 2024-02-21 DIAGNOSIS — Z1231 Encounter for screening mammogram for malignant neoplasm of breast: Secondary | ICD-10-CM | POA: Diagnosis present

## 2024-04-09 ENCOUNTER — Emergency Department
Admission: EM | Admit: 2024-04-09 | Discharge: 2024-04-09 | Disposition: A | Discharge status: Home / Self Care | Attending: Emergency Medicine | Admitting: Emergency Medicine

## 2024-04-09 ENCOUNTER — Other Ambulatory Visit: Payer: Self-pay

## 2024-04-09 DIAGNOSIS — H16012 Central corneal ulcer, left eye: Secondary | ICD-10-CM | POA: Insufficient documentation

## 2024-04-09 DIAGNOSIS — H5789 Other specified disorders of eye and adnexa: Secondary | ICD-10-CM | POA: Diagnosis present

## 2024-04-09 DIAGNOSIS — I10 Essential (primary) hypertension: Secondary | ICD-10-CM | POA: Diagnosis not present

## 2024-04-09 LAB — COMPREHENSIVE METABOLIC PANEL WITH GFR
ALT: 15 U/L (ref 0–44)
AST: 19 U/L (ref 15–41)
Albumin: 4.1 g/dL (ref 3.5–5.0)
Alkaline Phosphatase: 82 U/L (ref 38–126)
Anion gap: 8 (ref 5–15)
BUN: 12 mg/dL (ref 6–20)
CO2: 25 mmol/L (ref 22–32)
Calcium: 9.2 mg/dL (ref 8.9–10.3)
Chloride: 107 mmol/L (ref 98–111)
Creatinine, Ser: 0.77 mg/dL (ref 0.44–1.00)
GFR, Estimated: >60 mL/min (ref >60)
Glucose, Bld: 94 mg/dL (ref 70–99)
Potassium: 4.2 mmol/L (ref 3.5–5.1)
Sodium: 140 mmol/L (ref 135–145)
Total Bilirubin: 1.7 mg/dL — ABNORMAL HIGH (ref 0.0–1.2)
Total Protein: 7.6 g/dL (ref 6.5–8.1)

## 2024-04-09 LAB — CBC WITH DIFFERENTIAL/PLATELET
Abs Immature Granulocytes: 0.01 K/uL (ref 0.00–0.07)
Basophils Absolute: 0 K/uL (ref 0.0–0.1)
Basophils Relative: 0 %
Eosinophils Absolute: 0.1 K/uL (ref 0.0–0.5)
Eosinophils Relative: 1 %
HCT: 44.8 % (ref 36.0–46.0)
Hemoglobin: 14.4 g/dL (ref 12.0–15.0)
Immature Granulocytes: 0 %
Lymphocytes Relative: 28 %
Lymphs Abs: 2 K/uL (ref 0.7–4.0)
MCH: 27.7 pg (ref 26.0–34.0)
MCHC: 32.1 g/dL (ref 30.0–36.0)
MCV: 86.3 fL (ref 80.0–100.0)
Monocytes Absolute: 0.3 K/uL (ref 0.1–1.0)
Monocytes Relative: 4 %
Neutro Abs: 4.7 K/uL (ref 1.7–7.7)
Neutrophils Relative %: 67 %
Platelets: 214 K/uL (ref 150–400)
RBC: 5.19 MIL/uL — ABNORMAL HIGH (ref 3.87–5.11)
RDW: 12.9 % (ref 11.5–15.5)
WBC: 7.1 K/uL (ref 4.0–10.5)
nRBC: 0 % (ref 0.0–0.2)

## 2024-04-09 MED ORDER — KETOROLAC TROMETHAMINE 0.4 % OP SOLN: 1.0000 [drp] | Freq: Four times a day (QID) | OPHTHALMIC | 0 refills | Status: AC

## 2024-04-09 MED ORDER — TETRACAINE HCL 0.5 % OP SOLN
1.0000 [drp] | Freq: Once | OPHTHALMIC | Status: AC
  Administered 2024-04-09: 1 [drp] via OPHTHALMIC
  Filled 2024-04-09: qty 4

## 2024-04-09 MED ORDER — FLUORESCEIN SODIUM 1 MG OP STRP
1.0000 | ORAL_STRIP | Freq: Once | OPHTHALMIC | Status: DC
  Filled 2024-04-09: qty 1

## 2024-04-09 MED ORDER — CIPROFLOXACIN HCL 0.3 % OP SOLN: 1.0000 [drp] | Freq: Four times a day (QID) | OPHTHALMIC | 0 refills | Status: AC

## 2024-04-09 NOTE — ED Provider Notes (Signed)
 [facility] Provider Note   Event Date/Time   First MD Initiated Contact with Patient 04/09/24 1216     (approximate) History  Eye Problem  HPI Leah Rush is a 58 y.o. female with a past medical history of hypertension and noncontact lens wearer who presents complaining of swelling, erythema, and pain to the left eye.  Patient states that she was initially seen for similar symptoms by the ophthalmologist on 6/17 and placed on tobramycin/dexamethasone  eyedrops.  Patient states that she finished this course however the symptoms began to worsen whenever she stopped these eyedrops.  Patient endorses the sensation of a foreign body in her eye that feels like it is stabbing.  This pain is exacerbated by any movement of her eye. ROS: Patient currently denies any vision changes, tinnitus, difficulty speaking, facial droop, sore throat, chest pain, shortness of breath, abdominal pain, nausea/vomiting/diarrhea, dysuria, or weakness/numbness/paresthesias in any extremity   Physical Exam  Triage Vital Signs: ED Triage Vitals  Encounter Vitals Group     BP 04/09/24 1115 (!) 178/101     Girls Systolic BP Percentile --      Girls Diastolic BP Percentile --      Boys Systolic BP Percentile --      Boys Diastolic BP Percentile --      Pulse Rate 04/09/24 1115 71     Resp 04/09/24 1115 17     Temp 04/09/24 1115 98.8 F (37.1 C)     Temp Source 04/09/24 1115 Oral     SpO2 04/09/24 1115 100 %     Weight 04/09/24 1121 175 lb (79.4 kg)     Height 04/09/24 1121 5' 2 (1.575 m)     Head Circumference --      Peak Flow --      Pain Score 04/09/24 1120 7     Pain Loc --      Pain Education --      Exclude from Growth Chart --    Most recent vital signs: Vitals:   04/09/24 1215 04/09/24 1408  BP: (!) 172/96 (!) 143/78  Pulse: 70 72  Resp: 18 18  Temp:    SpO2: 100% 100%   General: Awake, oriented x4. CV:  Good peripheral perfusion. Resp:  Normal effort. Abd:  No  distention. Other:  Middle-aged obese African-American female resting comfortably in no acute distress Eye:  Left eye showing periorbital erythema and conjunctival injection.  There is a ulceration along the iris at the 8 o'clock position on fluorescein  exam.  No Seidel sign ED Results / Procedures / Treatments  Labs (all labs ordered are listed, but only abnormal results are displayed) Labs Reviewed  CBC WITH DIFFERENTIAL/PLATELET - Abnormal; Notable for the following components:      Result Value   RBC 5.19 (*)    All other components within normal limits  COMPREHENSIVE METABOLIC PANEL WITH GFR - Abnormal; Notable for the following components:   Total Bilirubin 1.7 (*)    All other components within normal limits   PROCEDURES: Critical Care performed: No Procedures MEDICATIONS ORDERED IN ED: Medications  fluorescein  ophthalmic strip 1 strip (has no administration in time range)  tetracaine  (PONTOCAINE) 0.5 % ophthalmic solution 1 drop (1 drop Left Eye Given by Other 04/09/24 1342)   IMPRESSION / MDM / ASSESSMENT AND PLAN / ED COURSE  I reviewed the triage vital signs and the nursing notes.  The patient is on the cardiac monitor to evaluate for evidence of arrhythmia and/or significant heart rate changes. Patient's presentation is most consistent with acute presentation with potential threat to life or bodily function. 58 year old female with history and exam consistent with corneal abrasion of the left eye.  Initial considerations in this patient included corneal abrasion, intraocular and corneal foreign bodies, corneal ulceration, various etiologies of iritis, and various etiologies of conjunctivitis amongst others.   Patient presented with eye pain and redness with associated photophobia, foreign body sensation, and decreased visual acuity.  Patient noted to have corneal abrasion on fluorescein  examination with wood's lamp of the eye.  No evidence of  foreign bodies with eversion of the eyelid.  Patient denies contact lens use, and has no other findings suggestive of corneal ulceration at this time.  No evidence of a positive Seidel test or other findings suggestive of globe perforation on evaluation in the ED.  No evidence of corneal foreign body on exam.   Significant improvement in pain and visual acuity noted with application of topical anesthetic to the eye.  Prior to discharge, we discussed return precautions, treatment with lubricating eye drops and NSAIDs, and follow up with primary care doctor within 1 week as needed for further evaluation, and the patient demonstrated understanding and agreement.   FINAL CLINICAL IMPRESSION(S) / ED DIAGNOSES   Final diagnoses:  Corneal ulcer, central, left   Rx / DC Orders   ED Discharge Orders          Ordered    ciprofloxacin  (CILOXAN ) 0.3 % ophthalmic solution  Every 6 hours        04/09/24 1359    ketorolac  (ACULAR ) 0.4 % SOLN  4 times daily        04/09/24 1400           Note:  This document was prepared using Dragon voice recognition software and may include unintentional dictation errors.   Jossie Artist POUR, MD 04/09/24 [id number]

## 2024-04-09 NOTE — ED Notes (Signed)
 Pt is CAOx4, breathing normally, and normal in color. Pt is complaining of left eye pain that has been going on for a few weeks now. Pt has seen eye doctor for same and has been prescribed medication. PT has finished the course of her eye drops, and the symptoms have returned. Pt's left eye noted to be red and swollen as well as dripping tears. PT in NAD at this time and denies any needs.

## 2024-04-09 NOTE — ED Triage Notes (Addendum)
 Pt sent by [facility] for ongoing problem to L eye. On 6/17, pt placed on tobradex by eye doctor for pain, blurred vision, and light sensitivity. Pt reports resolution of sx and she discontinued the tobradex on Saturday. Pt reports Saturday night, sx returned and gotten worse. Pt presents with L periorbital erythema and edema, blurred vision, conjunctival redness, and clear drainage. Pt reports sharp pain to eye with EOM. Pt wears glasses normally, no contacts.   [facility] B 20/71 R 20/70 L 20/200

## 2024-08-05 ENCOUNTER — Encounter: Payer: Self-pay | Admitting: Radiology
